# Patient Record
Sex: Female | Born: 1945 | Race: White | Hispanic: No | Marital: Married | Smoking: Never smoker
Health system: Southern US, Community
[De-identification: ages and names within clinical notes are randomized; demographics above are authoritative.]

## PROBLEM LIST (undated history)

## (undated) DIAGNOSIS — G839 Paralytic syndrome, unspecified: Secondary | ICD-10-CM

## (undated) DIAGNOSIS — I10 Essential (primary) hypertension: Secondary | ICD-10-CM

## (undated) DIAGNOSIS — K219 Gastro-esophageal reflux disease without esophagitis: Secondary | ICD-10-CM

## (undated) DIAGNOSIS — G35 Multiple sclerosis: Secondary | ICD-10-CM

## (undated) DIAGNOSIS — K5909 Other constipation: Secondary | ICD-10-CM

## (undated) DIAGNOSIS — G500 Trigeminal neuralgia: Secondary | ICD-10-CM

## (undated) HISTORY — DX: Other constipation: K59.09

## (undated) HISTORY — DX: Essential (primary) hypertension: I10

## (undated) HISTORY — DX: Gastro-esophageal reflux disease without esophagitis: K21.9

## (undated) HISTORY — PX: WISDOM TOOTH EXTRACTION: SHX21

## (undated) HISTORY — PX: TONSILLECTOMY: SUR1361

## (undated) HISTORY — DX: Multiple sclerosis: G35

## (undated) HISTORY — PX: OTHER SURGICAL HISTORY: SHX169

---

## 1976-10-13 DIAGNOSIS — G35 Multiple sclerosis: Secondary | ICD-10-CM

## 1976-10-13 HISTORY — DX: Multiple sclerosis: G35

## 2000-11-06 ENCOUNTER — Encounter: Admission: RE | Admit: 2000-11-06 | Discharge: 2000-11-06 | Payer: Self-pay | Admitting: Family Medicine

## 2000-11-06 ENCOUNTER — Encounter: Payer: Self-pay | Admitting: Family Medicine

## 2001-11-25 ENCOUNTER — Encounter: Admission: RE | Admit: 2001-11-25 | Discharge: 2001-11-25 | Payer: Self-pay | Admitting: Family Medicine

## 2001-11-25 ENCOUNTER — Encounter: Payer: Self-pay | Admitting: Family Medicine

## 2002-08-30 ENCOUNTER — Encounter: Payer: Self-pay | Admitting: Gastroenterology

## 2002-08-30 ENCOUNTER — Encounter: Admission: RE | Admit: 2002-08-30 | Discharge: 2002-08-30 | Payer: Self-pay | Admitting: Gastroenterology

## 2002-09-07 ENCOUNTER — Encounter: Admission: RE | Admit: 2002-09-07 | Discharge: 2002-09-07 | Payer: Self-pay | Admitting: Gastroenterology

## 2002-09-07 ENCOUNTER — Encounter: Payer: Self-pay | Admitting: Gastroenterology

## 2002-09-12 ENCOUNTER — Encounter: Payer: Self-pay | Admitting: Family Medicine

## 2002-09-12 ENCOUNTER — Encounter: Admission: RE | Admit: 2002-09-12 | Discharge: 2002-09-12 | Payer: Self-pay | Admitting: Family Medicine

## 2002-09-19 ENCOUNTER — Observation Stay (HOSPITAL_COMMUNITY): Admission: RE | Admit: 2002-09-19 | Discharge: 2002-09-20 | Payer: Self-pay | Admitting: Gastroenterology

## 2003-10-19 ENCOUNTER — Encounter: Admission: RE | Admit: 2003-10-19 | Discharge: 2003-10-19 | Payer: Self-pay | Admitting: Family Medicine

## 2010-03-26 ENCOUNTER — Encounter: Payer: Self-pay | Admitting: Cardiology

## 2010-07-31 ENCOUNTER — Encounter: Payer: Self-pay | Admitting: Cardiology

## 2010-08-27 ENCOUNTER — Ambulatory Visit (HOSPITAL_COMMUNITY): Admission: RE | Admit: 2010-08-27 | Discharge: 2010-08-27 | Payer: Self-pay | Admitting: Cardiology

## 2010-08-27 ENCOUNTER — Encounter: Payer: Self-pay | Admitting: Cardiology

## 2010-08-27 ENCOUNTER — Ambulatory Visit: Payer: Self-pay

## 2010-08-27 ENCOUNTER — Ambulatory Visit: Payer: Self-pay | Admitting: Cardiology

## 2010-08-27 ENCOUNTER — Ambulatory Visit: Payer: Self-pay | Admitting: Cardiovascular Disease

## 2010-08-27 DIAGNOSIS — R9431 Abnormal electrocardiogram [ECG] [EKG]: Secondary | ICD-10-CM

## 2010-08-27 DIAGNOSIS — R079 Chest pain, unspecified: Secondary | ICD-10-CM | POA: Insufficient documentation

## 2010-08-27 DIAGNOSIS — G35 Multiple sclerosis: Secondary | ICD-10-CM

## 2010-11-03 ENCOUNTER — Encounter: Payer: Self-pay | Admitting: Family Medicine

## 2010-11-06 ENCOUNTER — Ambulatory Visit
Admission: RE | Admit: 2010-11-06 | Discharge: 2010-11-06 | Payer: Self-pay | Source: Home / Self Care | Attending: Cardiology | Admitting: Cardiology

## 2010-11-12 NOTE — Consult Note (Signed)
Summary: [facility] Referral Request   [facility] Referral Request   Imported By: Roderic Ovens 09/20/2010 11:05:23  _____________________________________________________________________  External Attachment:    Type:   Image     Comment:   External Document

## 2010-11-12 NOTE — Assessment & Plan Note (Signed)
Summary: NP6/CHEST PAIN/CARDIAC EVAL   Visit Type:  Initial Consult Primary Provider:  Belva Agee, NP  CC:  chest pain.  History of Present Illness: The patient presents for evaluation of chest discomfort. She had 2 episodes of this in August. It was a sternal discomfort it didn't radiate to her left arm. It felt like a muscle cramp or a she had not had this before.  She took some aspirin. It lasted about 40 minutes. About 2 weeks later she had a similar episode but not as severe. There was no associated nausea or vomiting. She has had some progressive shortness of breath but this has been slowly happening over time.  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 She is completely dependent on her wheelchair and her husband insistence for daily activities. She is not describing PND or orthopnea. She does sleep with her head inclined in her bed. She is not describing palpitations, presyncope or syncope.   Current Medications (verified): 1)  Zoloft 100 Mg Tabs (Sertraline Hcl) .Marland Kitchen.. 1 By Mouth Daily 2)  Baclofen 20 Mg Tabs (Baclofen) .... 2 By Mouth Two Times A Day 3)  Vesicare 10 Mg Tabs (Solifenacin Succinate) .Marland Kitchen.. 1 Podaily 4)  Carbatrol 300 Mg Xr12h-Cap (Carbamazepine) .Marland Kitchen.. 1 By Mouth Daily 5)  Carbatrol 200 Mg Xr12h-Cap (Carbamazepine) .Marland Kitchen.. 1 By Mouth Daily 6)  Neurontin 300 Mg Caps (Gabapentin) .Marland Kitchen.. 1 By Mouth Dialy 7)  Ferro-Bob 325 (65 Fe) Mg Tabs (Ferrous Sulfate) .Marland Kitchen.. 1 By Mouth Daily 8)  Vitamin E 600 Unit Caps (Vitamin E) .Marland Kitchen.. 1 By Mouth Daily 9)  Calcium 1500 Mg Tabs (Calcium Carbonate) .Marland Kitchen.. 1 By Mouth Daily 10)  Abonax Inj .Marland Kitchen.. 1 Inj Weekly  Allergies (verified): 1)  ! * Microdantin 2)  ! Sulfa  Past History:  Past Medical History: Multiple Sclerosis 1978  Past Surgical History: Wisdom teath Tonsillectomy Suprapubic catheter Cholescystectomy  Family History: Father died of MI 38s Mother DM, died MI age 29  Social  History: Patient is married. She has no children. She's never smoked cigarettes. She doesn't drink alcohol.  Review of Systems       Positive for constipation and edema. Otherwise negative for all other systems.  Vital Signs:  Patient profile:   65 year old female Height:      62 inches Weight:      120 pounds BMI:     22.03 Pulse rate:   85 / minute Resp:     16 per minute BP sitting:   121 / 63  (right arm)  Vitals Entered By: Marrion Coy, CNA (August 27, 2010 1:50 PM)  Physical Exam  General:  Chronically ill-appearing but in no distress Head:  normocephalic and atraumatic Eyes:  PERRLA/EOM intact; conjunctiva and lids normal. Mouth:  Teeth, gums and palate normal. Oral mucosa normal. Neck:  Neck supple, no JVD. No masses, thyromegaly or abnormal cervical nodes. Chest Wall:  no deformities or breast masses noted Lungs:  Clear bilaterally to auscultation and percussion. Abdomen:  Bowel sounds positive; abdomen soft and non-tender without masses, organomegaly, or hernias noted. No hepatosplenomegaly. Msk:  Diffuse muscle wasting with contractions Extremities:  mild diffuse lower extremity edema Neurologic:  Alert and oriented x 3. Cervical Nodes:  no significant adenopathy Psych:  Normal affect.   Detailed Cardiovascular Exam  Neck    Carotids: Carotids full and equal bilaterally without bruits.      Neck Veins: Normal, no JVD.  Heart    Inspection: no deformities or lifts noted.      Palpation: normal PMI with no thrills palpable.      Auscultation: regular rate and rhythm, S1, S2 without murmurs, rubs, gallops, or clicks.    Vascular    Abdominal Aorta: no palpable masses, pulsations, or audible bruits.      Femoral Pulses: normal femoral pulses bilaterally.      Pedal Pulses: normal pedal pulses bilaterally.      Radial Pulses: normal radial pulses bilaterally.      Peripheral Circulation: no clubbing, cyanosis, or edema noted with normal capillary refill.      EKG  Procedure date:  07/30/2010  Findings:      Sinus rhythm, rate 73, left axis deviation, poor anterior R-wave progression  Impression & Recommendations:  Problem # 1:  ELECTROCARDIOGRAM, ABNORMAL (ICD-794.31) Patient did have new poor anterior R-wave progression and chest discomfort. She certainly has risk factors with her family history. She husband would like conservative management. I will order an echocardiogram to see if there has been demonstratable wall motion abnormality or a reduced ejection fraction. This will guide therapy. Otherwise in the absence of ongoing symptoms I would suggest nothing but to continue the aspirin which she started. Orders: Echocardiogram (Echo)  Problem # 2:  MULTIPLE SCLEROSIS, PROGRESSIVE/RELAPSING (ICD-340) The patient has severe multiple sclerosis and again wants conservative management.  Patient Instructions: 1)  Your physician recommends that you schedule a follow-up appointment in: 6 weeks in [place] office 2)  Your physician recommends that you continue on your current medications as directed. Please refer to the Current Medication list given to you today. 3)  Your physician has requested that you have an echocardiogram.  Echocardiography is a painless test that uses sound waves to create images of your heart. It provides your doctor with information about the size and shape of your heart and how well your heart's chambers and valves are working.  This procedure takes approximately one hour. There are no restrictions for this procedure.

## 2010-11-14 NOTE — Progress Notes (Signed)
Summary: [facility]   [facility] Office Note   Imported By: Roderic Ovens 09/24/2010 11:20:29  _____________________________________________________________________  External Attachment:    Type:   Image     Comment:   External Document

## 2010-11-14 NOTE — Assessment & Plan Note (Signed)
Summary: [facility]   Visit Type:  Follow-up Primary Provider:  Belva Agee, NP  CC:  chest pain.  History of Present Illness: The patient presents for follow up of chest pain.  Since I last saw her she has had no further chest pain.  At that time an echo demonstrated wall motion abnormalities and she was having no further symptoms no did not pursue a more aggressive evaluation. Since August of last year she has had no recurrent episodes. She has had no new shortness of breath, PND or orthopnea. She is paralyzed with multiple sclerosis and does have dyspnea secondary to muscular weakness.  Current Medications (verified): 1)  Zoloft 100 Mg Tabs (Sertraline Hcl) .Marland Kitchen.. 1 By Mouth Daily 2)  Baclofen 20 Mg Tabs (Baclofen) .... 2 By Mouth Two Times A Day 3)  Vesicare 10 Mg Tabs (Solifenacin Succinate) .Marland Kitchen.. 1 Podaily 4)  Carbatrol 300 Mg Xr12h-Cap (Carbamazepine) .Marland Kitchen.. 1 By Mouth Daily 5)  Carbatrol 200 Mg Xr12h-Cap (Carbamazepine) .Marland Kitchen.. 1 By Mouth Daily 6)  Neurontin 300 Mg Caps (Gabapentin) .Marland Kitchen.. 1 By Mouth Dialy 7)  Vitamin E 600 Unit Caps (Vitamin E) .Marland Kitchen.. 1 By Mouth Daily 8)  Abonax Inj .Marland Kitchen.. 1 Inj Weekly 9)  Bactrim .... 2 By Mouth Daily  Allergies (verified): 1)  ! * Microdantin 2)  ! Sulfa  Past History:  Past Medical History: Last updated: 08/27/2010 Multiple Sclerosis 1978  Past Surgical History: Reviewed history from 08/27/2010 and no changes required. Wisdom teath Tonsillectomy Suprapubic catheter Cholescystectomy  Review of Systems       As stated in the HPI and negative for all other systems.   Vital Signs:  Patient profile:   65 year old female Height:      62 inches Pulse rate:   80 / minute Resp:     16 per minute BP sitting:   108 / 66  (right arm)  Vitals Entered By: Marrion Coy, CNA (November 06, 2010 12:04 PM)  Physical Exam  General:  Well developed, well nourished, in no acute distress. Head:  normocephalic and atraumatic Neck:  Neck supple,  no JVD. No masses, thyromegaly or abnormal cervical nodes. Chest Wall:  no deformities or breast masses noted Lungs:  Clear bilaterally to auscultation and percussion. Heart:  Non-displaced PMI, chest non-tender; regular rate and rhythm, S1, S2 without murmurs, rubs or gallops. Carotid upstroke normal, no bruit. Normal abdominal aortic size, no bruits. Femorals normal pulses, no bruits. Pedals normal pulses. No edema, no varicosities. Abdomen:  Bowel sounds positive; abdomen soft and non-tender without masses, organomegaly, or hernias noted. No hepatosplenomegaly. Msk:  Diffuse muscle wasting with contractions Extremities:  mild diffuse lower extremity edema Neurologic:  Alert and oriented x 3.   Impression & Recommendations:  Problem # 1:  CHEST PAIN (ICD-786.50) Patient has had symptoms. No change in therapy is indicated. No further evaluation is indicated. She can come back to see [place] as needed.  Patient Instructions: 1)  Your physician recommends that you schedule a follow-up appointment as needed 2)  Your physician recommends that you continue on your current medications as directed. Please refer to the Current Medication list given to you today.

## 2011-01-29 ENCOUNTER — Encounter: Payer: Self-pay | Admitting: Cardiology

## 2011-02-04 ENCOUNTER — Telehealth: Payer: Self-pay | Admitting: Cardiology

## 2011-02-04 NOTE — Telephone Encounter (Signed)
Pt's husband calling due to pt's bp high at night then levels off in the am, pt on metoprolol given to her in the hospital-pls advise

## 2011-02-04 NOTE — Telephone Encounter (Signed)
Per call from husband - states pt went into [facility] 01/12/2011 for a kidney stone removal and while there had multiple problems including respiratory failure.  While there she was being treated for HTN and was placed on Metoprolol 50 mg daily.  This was increased 1 week ago to bid as her BP is remaining elevated.  This am her BP was 183/90 HR 97 and at 12 N 152/83 HR 105.  Husband states that pt is in some pain with mouth ulcers and knee pain.  I suggested the pt be seen back by the MD who started her on and increased her Metoprolol however was told by the husband that those doctors were seeing her for her kidney stone and do not wish to treat her hypertension.  Instructed husband to continue current medication and continue to check pt's BP and HR.  Suggested he call the MD how treats her pain for better pain control.  She will see Dr Antoine Poche in the [place] office 02/12/2011 at 3:45pm.  Husband is agreeable and will call back if BP doesn't continue to improve.

## 2011-02-05 ENCOUNTER — Telehealth: Payer: Self-pay | Admitting: Cardiology

## 2011-02-05 NOTE — Telephone Encounter (Signed)
Faxed to Release forms to  1. [facility] @ [phone] 2.[facility] @ [phone] 02/04/11/KM

## 2011-02-05 NOTE — Telephone Encounter (Signed)
Records received from [facility]...gave to Lela 02/05/11/Km

## 2011-02-11 ENCOUNTER — Encounter: Payer: Self-pay | Admitting: Cardiology

## 2011-02-12 ENCOUNTER — Ambulatory Visit (INDEPENDENT_AMBULATORY_CARE_PROVIDER_SITE_OTHER): Payer: Medicare Other | Admitting: Cardiology

## 2011-02-12 ENCOUNTER — Encounter: Payer: Self-pay | Admitting: Cardiology

## 2011-02-12 DIAGNOSIS — R9431 Abnormal electrocardiogram [ECG] [EKG]: Secondary | ICD-10-CM

## 2011-02-12 DIAGNOSIS — I10 Essential (primary) hypertension: Secondary | ICD-10-CM | POA: Insufficient documentation

## 2011-02-12 DIAGNOSIS — R079 Chest pain, unspecified: Secondary | ICD-10-CM

## 2011-02-12 MED ORDER — METOPROLOL TARTRATE 50 MG PO TABS: 50.0000 mg | ORAL_TABLET | Freq: Two times a day (BID) | ORAL | Status: DC

## 2011-02-12 NOTE — Assessment & Plan Note (Signed)
At this point I see no contraindication to beta blockers. She tolerates. Her husband blood pressure diary. If her blood pressure creeps up we will adjust meds.

## 2011-02-12 NOTE — Patient Instructions (Signed)
Continue current medications Follow up with Dr Antoine Poche in [place] in 3 months

## 2011-02-12 NOTE — Progress Notes (Signed)
HPI The patient presents for hypertension. She has had significant problems since I last saw her. She had kidney stone extraction and ended up with obstructive bowel, respiratory failure on BiPAP, pneumonia, renal insufficiency and hypertensive urgency at [facility]. She was readmitted later to another hospital with GI bleeding. She was told after finally being discharged from these hospitalizations to follow with a cardiologist for blood pressure. She was discharged on beta blockers. Her husband has been watching her blood pressures have been in the 140s over 80s. Her blood pressure is well controlled today. She has not had any chest pressure, neck or arm discomfort. There is no apparent palpitation or syncope. She is of course severely limited with her multiple sclerosis.  Allergies  Allergen Reactions  . Sulfonamide Derivatives     Current Outpatient Prescriptions  Medication Sig Dispense Refill  . acetaminophen (TYLENOL) 500 MG tablet Take 500 mg by mouth. 6 TIMES A DAY       . Alum & Mag Hydroxide-Simeth (MAGIC MOUTHWASH) SOLN Take by mouth. AS DIRECTED       . carbamazepine (CARBATROL) 300 MG 12 hr capsule Take 300 mg by mouth 2 (two) times daily.        . ferrous sulfate 325 (65 FE) MG tablet Take 325 mg by mouth 3 (three) times daily with meals.        . fluconazole (DIFLUCAN) 100 MG tablet Take 100 mg by mouth daily.        Marland Kitchen gabapentin (NEURONTIN) 300 MG capsule Take 300 mg by mouth. 1 TAB DAILY      . L-Lysine 500 MG TABS Take by mouth.        . metoprolol (LOPRESSOR) 50 MG tablet Take 50 mg by mouth 2 (two) times daily.        . pantoprazole (PROTONIX) 40 MG tablet Take 40 mg by mouth. TWICE A DAY       . sertraline (ZOLOFT) 100 MG tablet Take 100 mg by mouth daily.        . solifenacin (VESICARE) 10 MG tablet Take 5 mg by mouth daily.        . vitamin E 600 UNIT capsule Take 600 Units by mouth daily.        . baclofen (LIORESAL) 20 MG tablet Take 20 mg by mouth 3 (three)  times daily.        Marland Kitchen DISCONTD: carbamazepine (CARBATROL) 200 MG 12 hr capsule Take 200 mg by mouth 2 (two) times daily.        Marland Kitchen DISCONTD: carbamazepine (CARBATROL) 300 MG 12 hr capsule Take 300 mg by mouth. DAILY         Past Medical History  Diagnosis Date  . Multiple sclerosis   . Hypertension   . GERD (gastroesophageal reflux disease)   . Chronic constipation     Past Surgical History  Procedure Date  . Wisdom tooth extraction   . Tonsillectomy   . Suprapubic catheter   . Cholescystectomy     ROS:  Positive for fatigue, dyspnea, trigeminal neuralgia. Otherwise as stated in the history of present illness negative for all other systems.  PHYSICAL EXAM BP 124/67  Pulse 73  Ht 5\' 2"  (1.575 m)  Wt 115 lb (52.164 kg)  BMI 21.03 kg/m2General:  Chronically ill-appearing but in no distress Head:  normocephalic and atraumatic Eyes:  PERRLA/EOM intact; conjunctiva and lids normal. Mouth:  Teeth, gums and palate normal. Oral mucosa normal. Neck:  Neck supple, no JVD. No  masses, thyromegaly or abnormal cervical nodes. Chest Wall:  no deformities or breast masses noted Lungs:  Clear bilaterally to auscultation and percussion. Abdomen:  Bowel sounds positive; abdomen soft and non-tender without masses, organomegaly, or hernias noted. No hepatosplenomegaly. Msk:  Diffuse muscle wasting with contractions Extremities:  mild diffuse lower extremity edema Neurologic:  Alert and oriented x 3. Cervical Nodes:  no significant adenopathy Psych:  Normal affect.:  ASSESSMENT AND PLAN

## 2011-02-12 NOTE — Assessment & Plan Note (Signed)
She denies any chest pain.  No change in therapy is indicated.

## 2011-02-25 NOTE — Telephone Encounter (Signed)
Records received from [facility] to [place] 02/25/11/km

## 2011-02-28 NOTE — Op Note (Signed)
NAME:  Katie Woods, Katie Woods                        ACCOUNT NO.:  [id number]   MEDICAL RECORD NO.:  [id number]                   PATIENT TYPE:  OBV   LOCATION:  0455                                 FACILITY:  [facility]   PHYSICIAN:  Petra Kuba, M.D.                 DATE OF BIRTH:  1946-06-19   DATE OF PROCEDURE:  09/20/2002  DATE OF DISCHARGE:                                 OPERATIVE REPORT   PROCEDURE:  Colonoscopy.   INDICATIONS:  Patient with pain, weight loss, constipation, due for colonic  screening.  Consent was signed after risks, benefits, methods, and options  thoroughly discussed with both the patient and her husband.   MEDICATIONS:  Demerol 40 mg, Versed 4 mg.   DESCRIPTION OF PROCEDURE:  Rectal inspection was pertinent for external  hemorrhoids, small.  Digital exam was negative.  The pediatric video  adjustable colonoscope was inserted and despite a long, looping, and  tortuous colon was able to be advanced to the cecum.  This did require  rolling her on her back and some abdominal pressure.  No obvious abnormality  was seen on insertion.  The cecum was identified by the appendiceal orifice  and the ileocecal valve; in fact, the scope was inserted a short way into  the terminal ileum, which was normal.  Photo documentation was obtained.  The scope was slowly withdrawn.  The prep was fair at best.  There was  liquid stool throughout the colon, not all of which could be suctioned due  to clogging the scope on multiple occasions; however, on slow withdrawal  through the colon, no mass lesions, polyps, diverticula, signs of bleeding,  or any other abnormality was seen.  Once back in the rectum the scope was  retroflexed, revealing some internal hemorrhoids.  The scope was  straightened and readvanced a short way up the left side of the colon, air  was suctioned, the scope removed.  The patient tolerated the procedure well.  There was no obvious immediate complication.   ENDOSCOPIC DIAGNOSES:  1. Internal-external hemorrhoids.  2. Tortuous, long, looping colon.  3. Fair prep at best.  4. Otherwise within normal limits to the terminal ileum.    PLAN:  1. Continue Miralax and Bentyl since that seems to be helping.  2. Be happy to see back p.r.n., although difficult to get back in the office     per Dr. Collins Scotland.  Might want to consider upgrading the Bentyl to Librax or     even a trial of Zelnorm to help.                                               Petra Kuba, M.D.    MEM/MEDQ  D:  09/20/2002  T:  09/20/2002  Job:  [id number]   cc:   Tammy R. Collins [place], M.D.  [address]  [place]  [place] [address]  Fax: [phone]

## 2011-02-28 NOTE — H&P (Signed)
   NAME:  Katie Woods, Katie Woods NO.:  [id number]   MEDICAL RECORD NO.:  [id number]                   PATIENT TYPE:   LOCATION:                                       FACILITY:   PHYSICIAN:  Petra Kuba, M.D.                 DATE OF BIRTH:   DATE OF ADMISSION:  DATE OF DISCHARGE:                                HISTORY & PHYSICAL   HISTORY OF PRESENT ILLNESS:  The patient was admitted for elective GI prep  due to chronic abdominal pain and weight loss. Non-diagnostic workup to  date. Needing a colonoscopy. Possibly moving her bowels better has helped.  She has had a non-diagnostic CT ultrasound and upper GI small bowel follow  through.  The __ did seem to make her worse.   PAST MEDICAL HISTORY:  Pertinent for multiple sclerosis.   PAST SURGICAL HISTORY:  Cholecystectomy.   MEDICATIONS:  Include Baclofen, Nexium, Zoloft, Carbatrol, Zanaflex, Avonex,  Neurontin, Claritin, and Hiprex.   ALLERGIES:  MACRODANTIN.   FAMILY HISTORY:  Negative for any GI problems.   REVIEW OF SYSTEMS:  Fairly positive on multiple organ systems.   PHYSICAL EXAMINATION:  GENERAL: No acute distress.  HEENT: Sclera nonicteric.  VITAL SIGNS: See chart.  LUNGS: Clear.  HEART: Regular rate and rhythm.  ABDOMEN: Soft and nontender.   ASSESSMENT:  Abdominal pain and weight loss with non-diagnostic workup to  date. Overdue to colonic screening in a patient with multiple sclerosis.    PLAN:  Will go ahead with colonoscopy. Her and her husband and I have  discussed this in the office with further workup and plans. Please see that  dictation.                                               Petra Kuba, M.D.    MEM/MEDQ  D:  09/20/2002  T:  09/20/2002  Job:  [id number]   cc:   Tammy R. Collins [place], M.D.  [address]  [place]  [place] [address]  Fax: [phone]

## 2011-05-15 ENCOUNTER — Encounter: Payer: Self-pay | Admitting: Cardiology

## 2011-05-28 ENCOUNTER — Encounter: Payer: Self-pay | Admitting: Cardiology

## 2011-05-28 ENCOUNTER — Ambulatory Visit (INDEPENDENT_AMBULATORY_CARE_PROVIDER_SITE_OTHER): Payer: Medicare Other | Admitting: Cardiology

## 2011-05-28 DIAGNOSIS — R079 Chest pain, unspecified: Secondary | ICD-10-CM

## 2011-05-28 DIAGNOSIS — I10 Essential (primary) hypertension: Secondary | ICD-10-CM

## 2011-05-28 NOTE — Assessment & Plan Note (Signed)
Her blood pressure fluctuates but for the most part is controlled.  I did discuss prn dosing of the beta blocker if her pressure is up and sustained.  No further work up is indicated.

## 2011-05-28 NOTE — Patient Instructions (Signed)
Follow up as needed  The current medical regimen is effective;  continue present plan and medications.  

## 2011-05-28 NOTE — Progress Notes (Signed)
HPI The patient presents for hypertension. Since I last saw her she has had trouble with pain secondary to trigeminal neuralgia.  With this pain she has had some problems with her blood.  Her husband is not give her extra medication as he is afraid of "bottoming her out".  Her blood pressure goes down after her pain goes away.  She has had no chest pressure and now new SOB.  Allergies  Allergen Reactions  . Sulfonamide Derivatives     Current Outpatient Prescriptions  Medication Sig Dispense Refill  . acetaminophen (TYLENOL) 500 MG tablet Take 500 mg by mouth. 6 TIMES A DAY       . Alum & Mag Hydroxide-Simeth (MAGIC MOUTHWASH) SOLN Take by mouth. AS DIRECTED       . baclofen (LIORESAL) 20 MG tablet Take 20 mg by mouth 3 (three) times daily.        . carbamazepine (CARBATROL) 300 MG 12 hr capsule Take 300 mg by mouth 2 (two) times daily.        . ferrous sulfate 325 (65 FE) MG tablet Take 325 mg by mouth 3 (three) times daily with meals.        . fluconazole (DIFLUCAN) 100 MG tablet Take 100 mg by mouth daily.        Marland Kitchen gabapentin (NEURONTIN) 300 MG capsule Take 300 mg by mouth. 1 TAB DAILY      . L-Lysine 500 MG TABS Take by mouth.        . metoprolol (LOPRESSOR) 50 MG tablet Take 1 tablet (50 mg total) by mouth 2 (two) times daily.  180 tablet  3  . NON FORMULARY Inject as directed once a week. Abonax Inj       . pantoprazole (PROTONIX) 40 MG tablet Take 40 mg by mouth. TWICE A DAY       . sertraline (ZOLOFT) 100 MG tablet Take 100 mg by mouth daily.        . solifenacin (VESICARE) 10 MG tablet Take 5 mg by mouth daily.        . Sulfamethoxazole-Trimethoprim (BACTRIM PO) Take 2 tablets by mouth daily.        . vitamin E 600 UNIT capsule Take 600 Units by mouth daily.          Past Medical History  Diagnosis Date  . Multiple sclerosis 1978  . Hypertension   . GERD (gastroesophageal reflux disease)   . Chronic constipation     Past Surgical History  Procedure Date  . Wisdom tooth  extraction   . Tonsillectomy   . Suprapubic catheter   . Cholescystectomy     ROS:  Positive for fatigue, dyspnea, trigeminal neuralgia. Otherwise as stated in the history of present illness negative for all other systems.  PHYSICAL EXAM BP 122/68  Pulse 81  Resp 16 General:  Chronically ill-appearing but in no distress Head:  normocephalic and atraumatic Eyes:  PERRLA/EOM intact; conjunctiva and lids normal. Mouth:  Teeth, gums and palate normal. Oral mucosa normal. Neck:  Neck supple, no JVD. No masses, thyromegaly or abnormal cervical nodes. Chest Wall:  no deformities or breast masses noted Lungs:  Clear bilaterally to auscultation and percussion. Abdomen:  Bowel sounds positive; abdomen soft and non-tender without masses, organomegaly, or hernias noted. No hepatosplenomegaly. Msk:  Diffuse muscle wasting with contractions Extremities:  mild diffuse lower extremity edema Neurologic:  Alert and oriented x 3. Cervical Nodes:  no significant adenopathy Psych:  Normal affect.:  EKG:  NSR, rate 80 LAD, no acute ST T wave changes.  ASSESSMENT AND PLAN

## 2011-06-12 ENCOUNTER — Telehealth: Payer: Self-pay | Admitting: Cardiology

## 2011-06-12 DIAGNOSIS — I10 Essential (primary) hypertension: Secondary | ICD-10-CM

## 2011-06-12 NOTE — Telephone Encounter (Signed)
[employer] [facility]. 30 days supply  90 days supply [facility].

## 2011-06-13 MED ORDER — METOPROLOL TARTRATE 50 MG PO TABS: 50.0000 mg | ORAL_TABLET | Freq: Two times a day (BID) | ORAL | Status: DC

## 2011-06-13 NOTE — Telephone Encounter (Signed)
Pt will be out of med by Tuesday and needs refill called in today to [facility] pharmacy for 30 days, then 90 days to [facility]

## 2012-04-26 ENCOUNTER — Other Ambulatory Visit: Payer: Self-pay | Admitting: Family Medicine

## 2012-04-26 DIAGNOSIS — G8250 Quadriplegia, unspecified: Secondary | ICD-10-CM

## 2012-04-26 DIAGNOSIS — Z1231 Encounter for screening mammogram for malignant neoplasm of breast: Secondary | ICD-10-CM

## 2012-05-11 ENCOUNTER — Ambulatory Visit
Admission: RE | Admit: 2012-05-11 | Discharge: 2012-05-11 | Disposition: A | Discharge status: Home / Self Care | Payer: Medicare Other | Source: Ambulatory Visit | Attending: Family Medicine | Admitting: Family Medicine

## 2012-05-11 DIAGNOSIS — G8250 Quadriplegia, unspecified: Secondary | ICD-10-CM

## 2012-05-11 DIAGNOSIS — Z1231 Encounter for screening mammogram for malignant neoplasm of breast: Secondary | ICD-10-CM

## 2012-05-13 ENCOUNTER — Other Ambulatory Visit: Payer: Self-pay | Admitting: Family Medicine

## 2012-05-13 DIAGNOSIS — R928 Other abnormal and inconclusive findings on diagnostic imaging of breast: Secondary | ICD-10-CM

## 2012-05-20 ENCOUNTER — Ambulatory Visit
Admission: RE | Admit: 2012-05-20 | Discharge: 2012-05-20 | Disposition: A | Discharge status: Home / Self Care | Payer: Medicare Other | Source: Ambulatory Visit | Attending: Family Medicine | Admitting: Family Medicine

## 2012-05-20 DIAGNOSIS — R928 Other abnormal and inconclusive findings on diagnostic imaging of breast: Secondary | ICD-10-CM

## 2012-10-14 ENCOUNTER — Other Ambulatory Visit (HOSPITAL_COMMUNITY): Payer: Self-pay | Admitting: Oral and Maxillofacial Surgery

## 2012-10-14 DIAGNOSIS — R609 Edema, unspecified: Secondary | ICD-10-CM

## 2012-10-19 ENCOUNTER — Ambulatory Visit (HOSPITAL_COMMUNITY)
Admission: RE | Admit: 2012-10-19 | Discharge: 2012-10-19 | Disposition: A | Discharge status: Home / Self Care | Payer: Medicare Other | Source: Ambulatory Visit | Attending: Oral and Maxillofacial Surgery | Admitting: Oral and Maxillofacial Surgery

## 2012-10-19 DIAGNOSIS — R6884 Jaw pain: Secondary | ICD-10-CM | POA: Insufficient documentation

## 2012-10-19 DIAGNOSIS — J320 Chronic maxillary sinusitis: Secondary | ICD-10-CM | POA: Insufficient documentation

## 2012-10-19 DIAGNOSIS — R220 Localized swelling, mass and lump, head: Secondary | ICD-10-CM | POA: Insufficient documentation

## 2012-10-19 DIAGNOSIS — R609 Edema, unspecified: Secondary | ICD-10-CM

## 2012-10-19 DIAGNOSIS — R51 Headache: Secondary | ICD-10-CM | POA: Insufficient documentation

## 2012-10-19 LAB — CREATININE, SERUM: GFR calc Af Amer: >90 mL/min (ref >90)

## 2012-10-19 LAB — BUN: BUN: 37 mg/dL — ABNORMAL HIGH (ref 6–23)

## 2012-10-19 MED ORDER — IOHEXOL 300 MG/ML  SOLN
80.0000 mL | Freq: Once | INTRAMUSCULAR | Status: AC | PRN
  Administered 2012-10-19: 80 mL via INTRAVENOUS

## 2013-11-21 DIAGNOSIS — N319 Neuromuscular dysfunction of bladder, unspecified: Secondary | ICD-10-CM | POA: Diagnosis not present

## 2013-12-12 DIAGNOSIS — R319 Hematuria, unspecified: Secondary | ICD-10-CM | POA: Diagnosis not present

## 2013-12-12 DIAGNOSIS — R509 Fever, unspecified: Secondary | ICD-10-CM | POA: Diagnosis not present

## 2013-12-12 DIAGNOSIS — R82998 Other abnormal findings in urine: Secondary | ICD-10-CM | POA: Diagnosis not present

## 2013-12-12 DIAGNOSIS — J069 Acute upper respiratory infection, unspecified: Secondary | ICD-10-CM | POA: Diagnosis not present

## 2013-12-12 DIAGNOSIS — R110 Nausea: Secondary | ICD-10-CM | POA: Diagnosis not present

## 2013-12-26 DIAGNOSIS — R319 Hematuria, unspecified: Secondary | ICD-10-CM | POA: Diagnosis not present

## 2013-12-26 DIAGNOSIS — G35 Multiple sclerosis: Secondary | ICD-10-CM | POA: Diagnosis not present

## 2013-12-26 DIAGNOSIS — I10 Essential (primary) hypertension: Secondary | ICD-10-CM | POA: Diagnosis not present

## 2013-12-26 DIAGNOSIS — G500 Trigeminal neuralgia: Secondary | ICD-10-CM | POA: Diagnosis not present

## 2013-12-26 DIAGNOSIS — G8250 Quadriplegia, unspecified: Secondary | ICD-10-CM | POA: Diagnosis not present

## 2013-12-26 DIAGNOSIS — N390 Urinary tract infection, site not specified: Secondary | ICD-10-CM | POA: Diagnosis not present

## 2014-03-13 DIAGNOSIS — R319 Hematuria, unspecified: Secondary | ICD-10-CM | POA: Diagnosis not present

## 2014-03-23 IMAGING — CT CT NECK W/ CM
4 of 5 series · 15 of 33 positions shown, 17 images · IV contrast (CONTRAST)
Comparison: None.

CLINICAL DATA: Right jaw and facial pain and swelling.

CT NECK WITH CONTRAST
TECHNIQUE: Multidetector CT imaging of the neck was performed with
intravenous contrast.
Contrast: 80mL OMNIPAQUE IOHEXOL 300 MG/ML  SOLN

[Series 2: soft tissue · axial · 0.52mm/px · z∈[-127,+29]mm · 5 of 118 slices shown]
[im 20/118  soft-tissue]
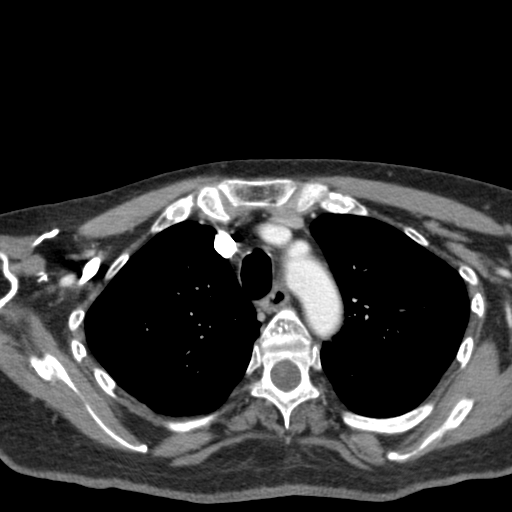
[im 40/118  soft-tissue]
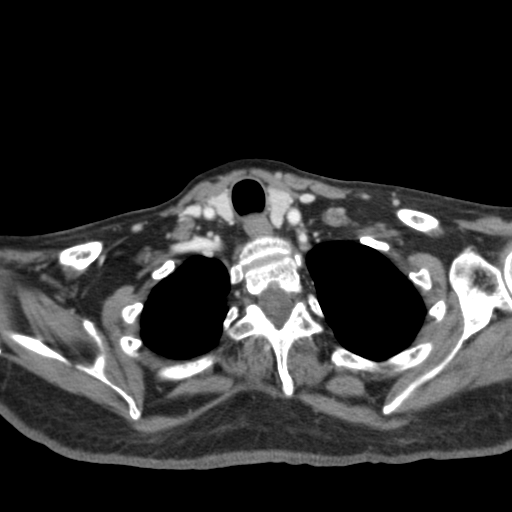
[im 59/118  soft-tissue]
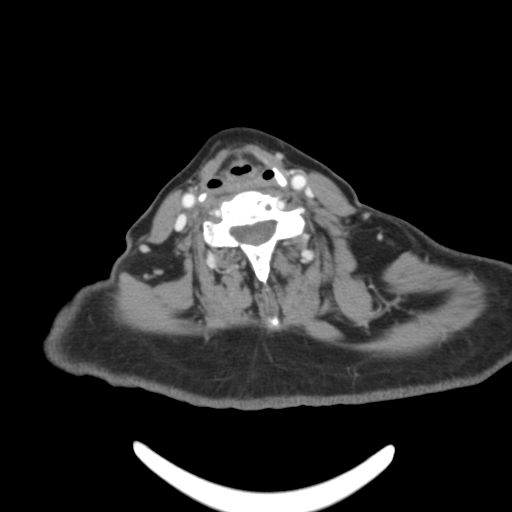
[im 79/118  soft-tissue]
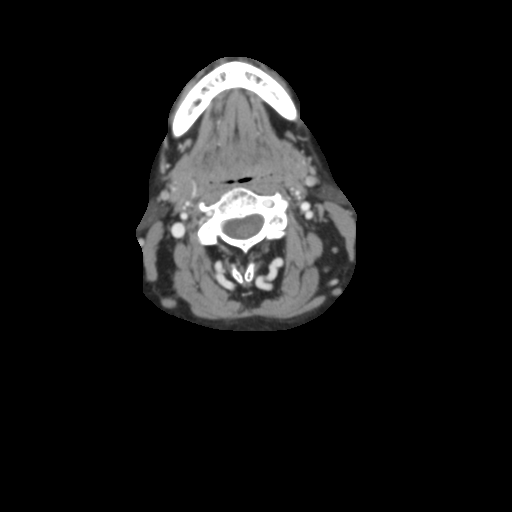
[im 98/118  soft-tissue]
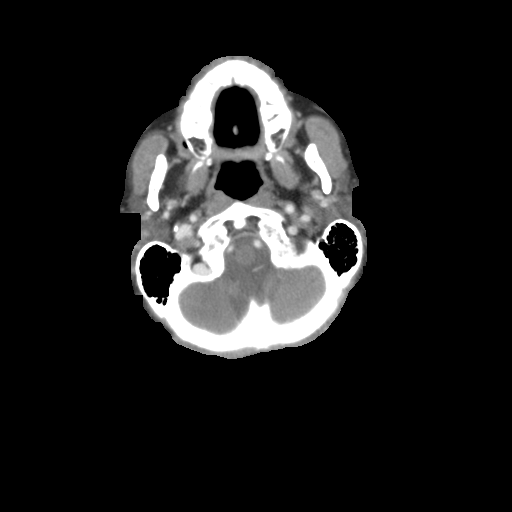

[axial · axial · 0.52mm/px · z∈[-92,-20]mm · 2 of 74 slices shown, 3 images]
[im 25/74  soft-tissue]
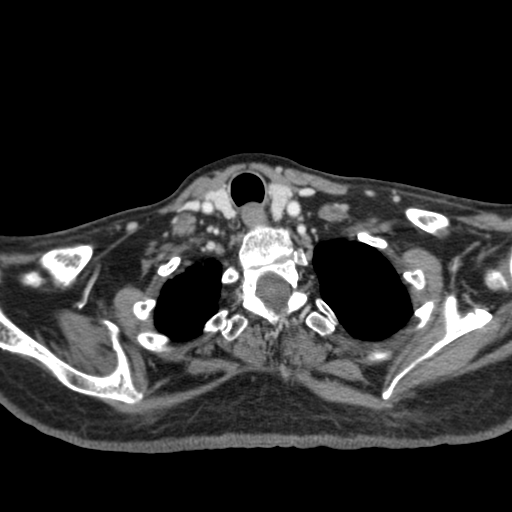
[im 25/74  bone]
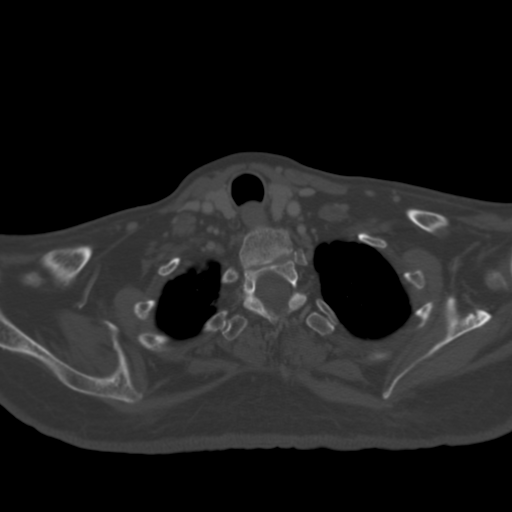
[im 49/74  bone]
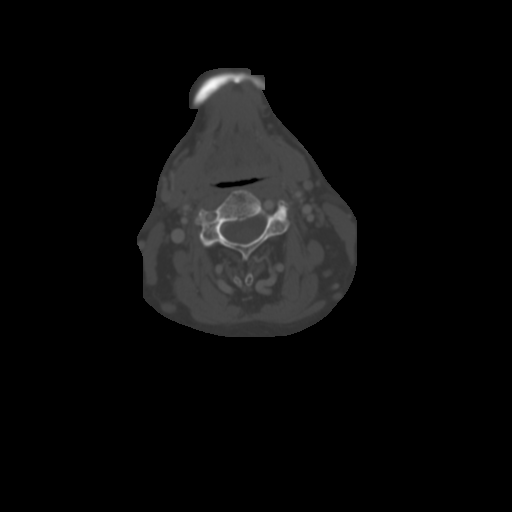

[coronal · coronal · 0.52mm/px · 3 of 67 slices shown]
[im 14/67  bone]
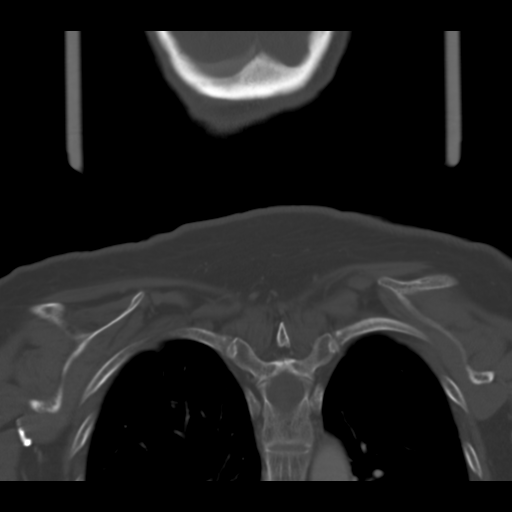
[im 27/67  bone]
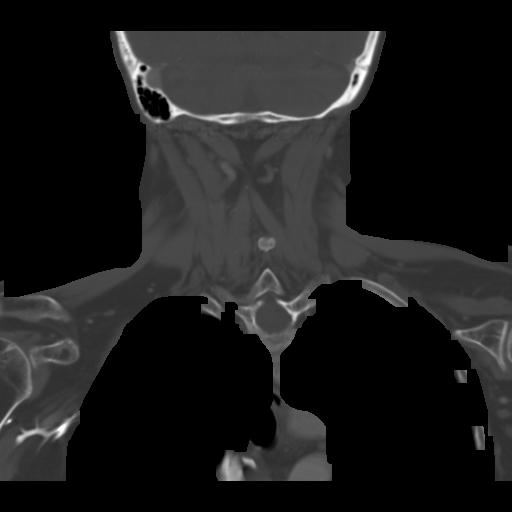
[im 40/67  bone]
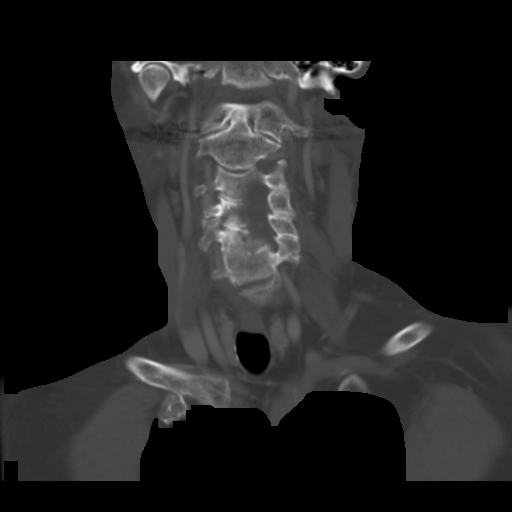

[sagittal · sagittal · 0.52mm/px · 5 of 84 slices shown, 6 images]
[im 28/84  bone]
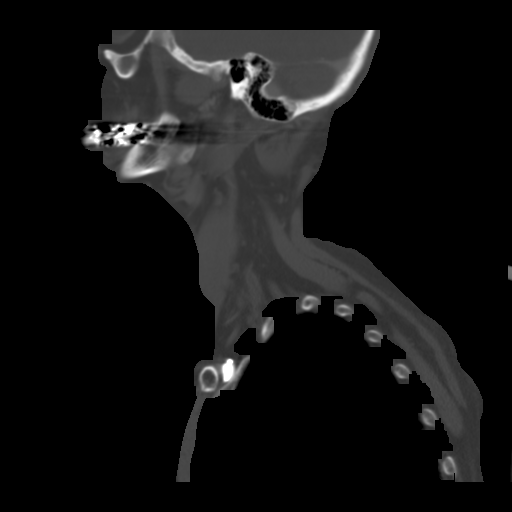
[im 35/84  bone]
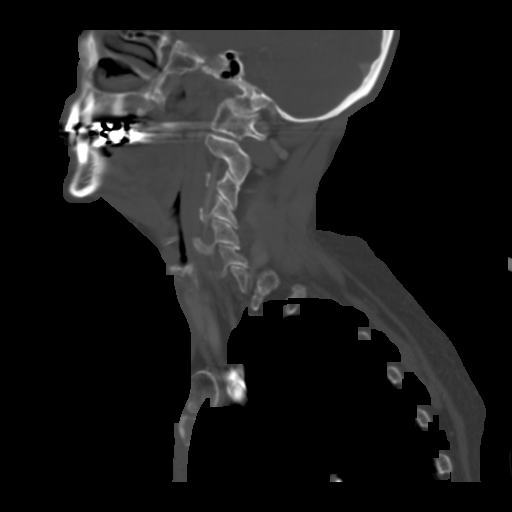
[im 42/84  soft-tissue]
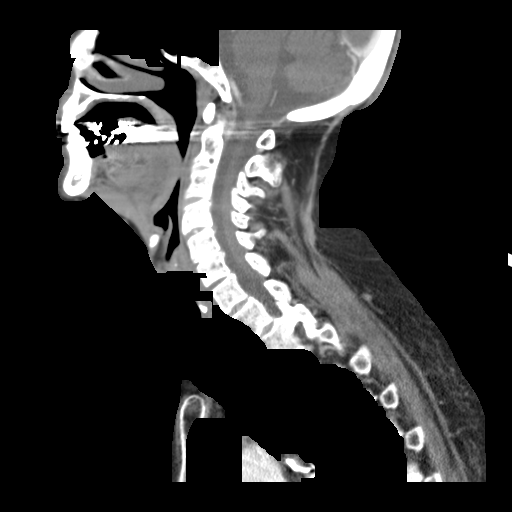
[im 42/84  bone]
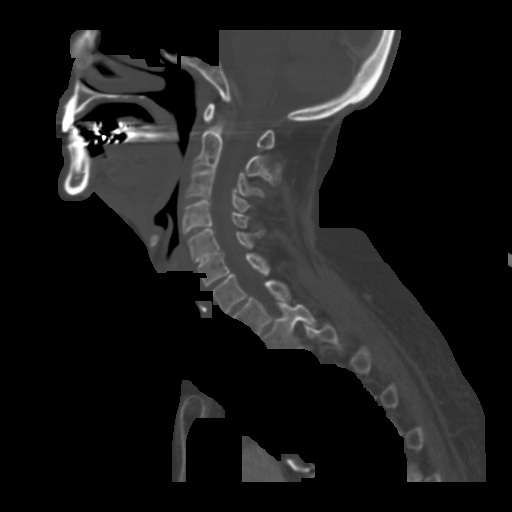
[im 49/84  bone]
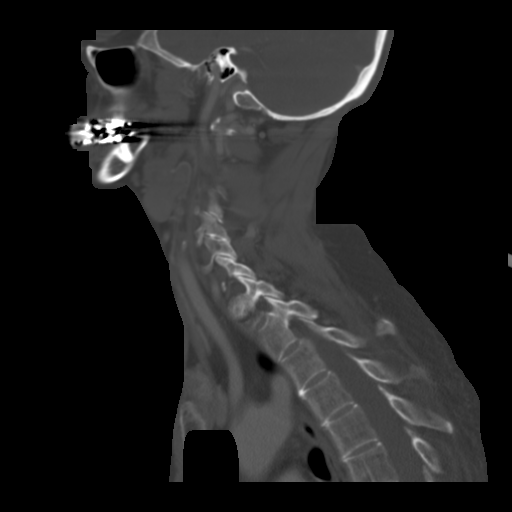
[im 56/84  bone]
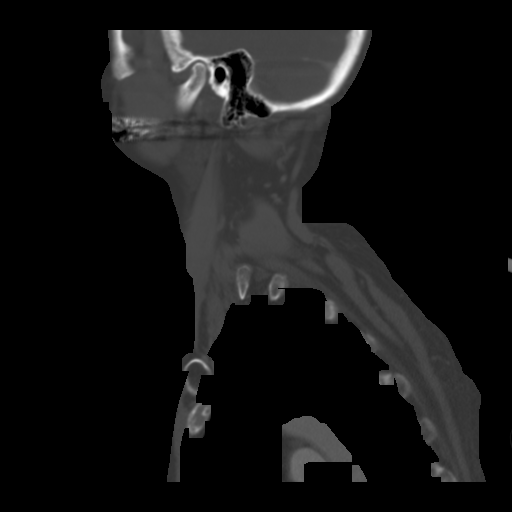

[15 of 33 positions shown; findings below may reference images not displayed]

FINDINGS: BB marks a symptomatic area overlying the right cheek.
No mass lesion is seen in this area.  There is no adenopathy.  No
subcutaneous or skin edema is present.  There is some artifact in
the area due to dental work.

Parotid and submandibular glands are normal bilaterally.  There is
chronic sinusitis with mucosal edema opacifying the right maxillary
sinus.  No acute bony change.

Tongue base is normal.  Epiglottis and larynx are normal.  Thyroid
is normal.  Lung apices are clear.  Mild disc degeneration and
spondylosis C3-C7.  No acute bony change.
IMPRESSION: No imaging correlate is seen for the palpable area overlying the
right cheek.

Chronic sinusitis right maxillary sinus.

Negative for mass or adenopathy.

## 2014-03-27 DIAGNOSIS — N319 Neuromuscular dysfunction of bladder, unspecified: Secondary | ICD-10-CM | POA: Diagnosis not present

## 2014-03-27 DIAGNOSIS — N390 Urinary tract infection, site not specified: Secondary | ICD-10-CM | POA: Diagnosis not present

## 2014-03-27 DIAGNOSIS — K219 Gastro-esophageal reflux disease without esophagitis: Secondary | ICD-10-CM | POA: Diagnosis not present

## 2014-03-28 DIAGNOSIS — I10 Essential (primary) hypertension: Secondary | ICD-10-CM | POA: Diagnosis not present

## 2014-03-28 DIAGNOSIS — G35 Multiple sclerosis: Secondary | ICD-10-CM | POA: Diagnosis not present

## 2014-03-28 DIAGNOSIS — G8250 Quadriplegia, unspecified: Secondary | ICD-10-CM | POA: Diagnosis not present

## 2014-03-28 DIAGNOSIS — G500 Trigeminal neuralgia: Secondary | ICD-10-CM | POA: Diagnosis not present

## 2014-04-05 DIAGNOSIS — N200 Calculus of kidney: Secondary | ICD-10-CM | POA: Diagnosis not present

## 2014-07-19 DIAGNOSIS — Z23 Encounter for immunization: Secondary | ICD-10-CM | POA: Diagnosis not present

## 2014-08-09 DIAGNOSIS — Z23 Encounter for immunization: Secondary | ICD-10-CM | POA: Diagnosis not present

## 2015-02-27 DIAGNOSIS — G35 Multiple sclerosis: Secondary | ICD-10-CM | POA: Diagnosis not present

## 2015-02-27 DIAGNOSIS — G8250 Quadriplegia, unspecified: Secondary | ICD-10-CM | POA: Diagnosis not present

## 2015-02-27 DIAGNOSIS — G500 Trigeminal neuralgia: Secondary | ICD-10-CM | POA: Diagnosis not present

## 2015-02-27 DIAGNOSIS — Z79899 Other long term (current) drug therapy: Secondary | ICD-10-CM | POA: Diagnosis not present

## 2015-04-25 ENCOUNTER — Encounter: Payer: Self-pay | Admitting: Physical Therapy

## 2015-04-25 ENCOUNTER — Ambulatory Visit: Payer: Medicare Other | Attending: Neurology | Admitting: Physical Therapy

## 2015-04-25 DIAGNOSIS — M6249 Contracture of muscle, multiple sites: Secondary | ICD-10-CM | POA: Insufficient documentation

## 2015-04-25 DIAGNOSIS — M6281 Muscle weakness (generalized): Secondary | ICD-10-CM | POA: Diagnosis not present

## 2015-04-25 DIAGNOSIS — M62838 Other muscle spasm: Secondary | ICD-10-CM

## 2015-04-25 NOTE — Therapy (Signed)
[facility] [address] [place], [place], [address] Phone: [phone]   Fax:  [phone]  Physical Therapy Evaluation  Patient Details  Name: Katie Woods MRN: [id number] Date of Birth: [birth year] Referring Provider:  Beryle Beams, MD  Encounter Date: 2015-05-17      PT End of Session - 2015-05-17 2250    Visit Number 1   Number of Visits 3   Date for PT Re-Evaluation 06/24/15   Authorization Type Medicare   Authorization Time Period 05/17/15 - 06-24-15   PT Start Time 1400   PT Stop Time 1516   PT Time Calculation (min) 76 min      Past Medical History  Diagnosis Date  . Multiple sclerosis 1978  . Hypertension   . GERD (gastroesophageal reflux disease)   . Chronic constipation     Past Surgical History  Procedure Laterality Date  . Wisdom tooth extraction    . Tonsillectomy    . Suprapubic catheter    . Cholescystectomy      There were no vitals filed for this visit.  Visit Diagnosis:  Muscle weakness (generalized) - Plan: PT plan of care cert/re-cert  Muscle spasticity - Plan: PT plan of care cert/re-cert    Power wheelchair with sip and puff control and one with head control to be trialed to determine best option for pt; LMN to be completed after wheelchair trial and final recommendations have been completed                              PT Long Term Goals - 2015-05-17 2255    PT LONG TERM GOAL #1   Title Pt. will participate in trial of wheelchair controls, i.e sip & puff vs. head control, to determine best option for pt to safely operate wheelchair.   Baseline 06-24-15   Time 8   Period Weeks   Status New               Plan - 05/17/2015 02-24-2251    Clinical Impression Statement pt may be able to maneuver power wheelchair with use of head control; vendor wants to try sip and puff; both controls to be trialed to determine easiest option fo pt   Pt will benefit  from skilled therapeutic intervention in order to improve on the following deficits Decreased mobility   Rehab Potential Good   PT Frequency 1x / week   PT Duration 3 weeks   PT Treatment/Interventions Wheelchair mobility training;ADLs/Self Care Home Management;Patient/family education   PT Next Visit Plan trial of sip and puff w/chair and head control to determine appropriate control for wheelchair   Consulted and Agree with Plan of Care Patient;Family member/caregiver   Family Member Consulted husband          G-Codes - 2015-05-17 Feb 23, 2257    Functional Assessment Tool Used dependent for all mobiltiy   Functional Limitation Mobility: Walking and moving around   Mobility: Walking and Moving Around Current Status [phone]) 100 percent impaired, limited or restricted   Mobility: Walking and Moving Around Goal Status [phone]) At least 80 percent but less than 100 percent impaired, limited or restricted       Problem List Patient Active Problem List   Diagnosis Date Noted  . HTN (hypertension) 02/12/2011  . MULTIPLE SCLEROSIS, PROGRESSIVE/RELAPSING 08/27/2010  . CHEST PAIN 08/27/2010  . ELECTROCARDIOGRAM, ABNORMAL 08/27/2010    Kary Kos, PT 05/17/15, 11:04 PM  [facility] [address] [place], [place], [address] Phone: [phone]   Fax:  [phone]

## 2015-06-04 ENCOUNTER — Ambulatory Visit: Payer: Medicare Other | Admitting: Physical Therapy

## 2015-06-21 ENCOUNTER — Ambulatory Visit: Payer: Medicare Other | Admitting: Physical Therapy

## 2015-07-09 DIAGNOSIS — M818 Other osteoporosis without current pathological fracture: Secondary | ICD-10-CM | POA: Diagnosis not present

## 2015-07-09 DIAGNOSIS — R252 Cramp and spasm: Secondary | ICD-10-CM | POA: Diagnosis not present

## 2015-07-09 DIAGNOSIS — R5383 Other fatigue: Secondary | ICD-10-CM | POA: Diagnosis not present

## 2015-07-09 DIAGNOSIS — G500 Trigeminal neuralgia: Secondary | ICD-10-CM | POA: Diagnosis not present

## 2015-07-09 DIAGNOSIS — G4733 Obstructive sleep apnea (adult) (pediatric): Secondary | ICD-10-CM | POA: Diagnosis not present

## 2015-07-09 DIAGNOSIS — K219 Gastro-esophageal reflux disease without esophagitis: Secondary | ICD-10-CM | POA: Diagnosis not present

## 2015-07-09 DIAGNOSIS — E559 Vitamin D deficiency, unspecified: Secondary | ICD-10-CM | POA: Diagnosis not present

## 2015-07-09 DIAGNOSIS — G35 Multiple sclerosis: Secondary | ICD-10-CM | POA: Diagnosis not present

## 2015-07-09 DIAGNOSIS — G8252 Quadriplegia, C1-C4 incomplete: Secondary | ICD-10-CM | POA: Diagnosis not present

## 2015-07-09 DIAGNOSIS — E538 Deficiency of other specified B group vitamins: Secondary | ICD-10-CM | POA: Diagnosis not present

## 2015-07-09 DIAGNOSIS — Z79899 Other long term (current) drug therapy: Secondary | ICD-10-CM | POA: Diagnosis not present

## 2015-07-09 DIAGNOSIS — R0600 Dyspnea, unspecified: Secondary | ICD-10-CM | POA: Diagnosis not present

## 2015-07-26 ENCOUNTER — Ambulatory Visit: Payer: Medicare Other | Attending: Neurology | Admitting: Physical Therapy

## 2015-07-26 DIAGNOSIS — M6281 Muscle weakness (generalized): Secondary | ICD-10-CM | POA: Diagnosis not present

## 2015-07-26 DIAGNOSIS — M62838 Other muscle spasm: Secondary | ICD-10-CM

## 2015-07-26 DIAGNOSIS — M6249 Contracture of muscle, multiple sites: Secondary | ICD-10-CM | POA: Diagnosis not present

## 2015-07-27 ENCOUNTER — Encounter: Payer: Self-pay | Admitting: Physical Therapy

## 2015-07-27 NOTE — Therapy (Signed)
[facility] [address] [place], [place], [address] Phone: [phone]   Fax:  [phone]  Physical Therapy Treatment  Patient Details  Name: TORI DATTILIO MRN: [id number] Date of Birth: [birth year] No Data Recorded  Encounter Date: 16-Aug-2015      PT End of Session - 07/27/15 0924    Visit Number 2   Number of Visits 3   Date for PT Re-Evaluation 07/24/15   Authorization Type Medicare   Authorization Time Period 04-25-15 - 06-24-15   PT Start Time 1318   PT Stop Time 1407   PT Time Calculation (min) 49 min      Past Medical History  Diagnosis Date  . Multiple sclerosis ([place]) 1978  . Hypertension   . GERD (gastroesophageal reflux disease)   . Chronic constipation     Past Surgical History  Procedure Laterality Date  . Wisdom tooth extraction    . Tonsillectomy    . Suprapubic catheter    . Cholescystectomy      There were no vitals filed for this visit.  Visit Diagnosis:  Muscle weakness (generalized)  Muscle spasticity      Subjective Assessment - 07/27/15 0922    Subjective pt ready to trial sip and puff wheelchair - accompanied to PT by husband with wheelchair vendor present   Patient is accompained by: Family member  husband and w/c vendor   Currently in Pain? No/denies       Wheelchair management - sip and puff set up on a trial power wheelchair; pt was transferred from her wheelchair into [place] with sip and puff -- pt performed wheelchair propulsion forward via sipping and practiced moving chair in reverse with  Puffing -- pt practiced maneuvering wheelchair in clinic gym with wheelchair vendor Tad Moore, [employer] from [employer] for instruction and used a kill switch attached to chair for safety and to assist pt in maneuvering and operating power Wheelchair with this sip & puff device                               PT Long Term  Goals - 07/27/15 0927    PT LONG TERM GOAL #1   Title Pt. will participate in trial of wheelchair controls, i.e sip & puff vs. head control, to determine best option for pt to safely operate wheelchair.   Baseline met 08/16/15   Status Achieved               Plan - 07/27/15 0925    Clinical Impression Statement pt able to safely use sip and puff for maneuvering power wheelchair - will need some more practice but pt able to safely propel wheelchair with use of sip & puff in clinic   Pt will benefit from skilled therapeutic intervention in order to improve on the following deficits Decreased mobility   Rehab Potential Good   PT Frequency 1x / week   PT Duration 3 weeks   PT Treatment/Interventions Wheelchair mobility training;ADLs/Self Care Home Management;Patient/family education   PT Next Visit Plan trial of sip and puff w/chair and head control to determine appropriate control for wheelchair   Consulted and Agree with Plan of Care Patient;Family member/caregiver;Other (Comment)  wheelchair vendor Tad Moore present   Family Member Consulted husband          G-Codes - August 16, 2015 0102    Functional Assessment Tool Used Clinical judgment  Functional Limitation Dependent for all mobility   Mobility: Walking and Moving Around Goal Status [phone]) CM   Mobility: Walking and Moving Around Discharge Status [phone]) CM      Problem List Patient Active Problem List   Diagnosis Date Noted  . HTN (hypertension) 02/12/2011  . MULTIPLE SCLEROSIS, PROGRESSIVE/RELAPSING 08/27/2010  . CHEST PAIN 08/27/2010  . ELECTROCARDIOGRAM, ABNORMAL 08/27/2010    Alda Lea, PT 07/27/2015, 9:31 AM  [facility] [address] [place], [place], [address] Phone: [phone]   Fax:  [phone]  Name: TANGANIKA BARRADAS MRN: [id number] Date of Birth: May 17, 1946

## 2015-08-14 DIAGNOSIS — G35 Multiple sclerosis: Secondary | ICD-10-CM | POA: Diagnosis not present

## 2015-08-14 DIAGNOSIS — G4730 Sleep apnea, unspecified: Secondary | ICD-10-CM | POA: Diagnosis not present

## 2015-08-14 DIAGNOSIS — R0600 Dyspnea, unspecified: Secondary | ICD-10-CM | POA: Diagnosis not present

## 2015-09-03 DIAGNOSIS — G4733 Obstructive sleep apnea (adult) (pediatric): Secondary | ICD-10-CM | POA: Diagnosis not present

## 2015-09-03 DIAGNOSIS — G35 Multiple sclerosis: Secondary | ICD-10-CM | POA: Diagnosis not present

## 2015-09-03 DIAGNOSIS — I10 Essential (primary) hypertension: Secondary | ICD-10-CM | POA: Diagnosis not present

## 2015-09-03 DIAGNOSIS — G8250 Quadriplegia, unspecified: Secondary | ICD-10-CM | POA: Diagnosis not present

## 2015-09-03 DIAGNOSIS — G500 Trigeminal neuralgia: Secondary | ICD-10-CM | POA: Diagnosis not present

## 2015-09-03 DIAGNOSIS — R0600 Dyspnea, unspecified: Secondary | ICD-10-CM | POA: Diagnosis not present

## 2015-09-05 DIAGNOSIS — J449 Chronic obstructive pulmonary disease, unspecified: Secondary | ICD-10-CM | POA: Diagnosis not present

## 2015-09-12 DIAGNOSIS — Z23 Encounter for immunization: Secondary | ICD-10-CM | POA: Diagnosis not present

## 2015-09-20 DIAGNOSIS — I10 Essential (primary) hypertension: Secondary | ICD-10-CM | POA: Diagnosis not present

## 2015-09-20 DIAGNOSIS — G8250 Quadriplegia, unspecified: Secondary | ICD-10-CM | POA: Diagnosis not present

## 2015-09-20 DIAGNOSIS — R2689 Other abnormalities of gait and mobility: Secondary | ICD-10-CM | POA: Diagnosis not present

## 2015-09-20 DIAGNOSIS — G35 Multiple sclerosis: Secondary | ICD-10-CM | POA: Diagnosis not present

## 2015-10-03 ENCOUNTER — Other Ambulatory Visit (HOSPITAL_COMMUNITY): Payer: Self-pay | Admitting: Respiratory Therapy

## 2015-10-03 DIAGNOSIS — I10 Essential (primary) hypertension: Secondary | ICD-10-CM

## 2015-10-03 DIAGNOSIS — G500 Trigeminal neuralgia: Secondary | ICD-10-CM

## 2015-10-03 DIAGNOSIS — G8250 Quadriplegia, unspecified: Secondary | ICD-10-CM

## 2015-10-03 DIAGNOSIS — G4733 Obstructive sleep apnea (adult) (pediatric): Secondary | ICD-10-CM

## 2015-10-03 DIAGNOSIS — G35 Multiple sclerosis: Secondary | ICD-10-CM

## 2015-10-03 DIAGNOSIS — R0600 Dyspnea, unspecified: Secondary | ICD-10-CM

## 2018-06-01 ENCOUNTER — Encounter (HOSPITAL_BASED_OUTPATIENT_CLINIC_OR_DEPARTMENT_OTHER): Payer: Medicare Other | Attending: Internal Medicine

## 2018-06-01 DIAGNOSIS — G35 Multiple sclerosis: Secondary | ICD-10-CM | POA: Diagnosis not present

## 2018-06-01 DIAGNOSIS — G4730 Sleep apnea, unspecified: Secondary | ICD-10-CM | POA: Diagnosis not present

## 2018-06-01 DIAGNOSIS — L89153 Pressure ulcer of sacral region, stage 3: Secondary | ICD-10-CM | POA: Insufficient documentation

## 2018-06-15 ENCOUNTER — Encounter (HOSPITAL_BASED_OUTPATIENT_CLINIC_OR_DEPARTMENT_OTHER): Payer: Medicare Other | Attending: Internal Medicine

## 2018-06-15 DIAGNOSIS — G35 Multiple sclerosis: Secondary | ICD-10-CM | POA: Diagnosis not present

## 2018-06-15 DIAGNOSIS — G4733 Obstructive sleep apnea (adult) (pediatric): Secondary | ICD-10-CM | POA: Insufficient documentation

## 2018-06-15 DIAGNOSIS — L89153 Pressure ulcer of sacral region, stage 3: Secondary | ICD-10-CM | POA: Diagnosis present

## 2018-06-30 DIAGNOSIS — L89153 Pressure ulcer of sacral region, stage 3: Secondary | ICD-10-CM | POA: Diagnosis not present

## 2018-07-20 ENCOUNTER — Encounter (HOSPITAL_BASED_OUTPATIENT_CLINIC_OR_DEPARTMENT_OTHER): Payer: Medicare Other | Attending: Internal Medicine

## 2018-07-20 DIAGNOSIS — L89154 Pressure ulcer of sacral region, stage 4: Secondary | ICD-10-CM | POA: Diagnosis present

## 2018-07-20 DIAGNOSIS — G35 Multiple sclerosis: Secondary | ICD-10-CM | POA: Insufficient documentation

## 2018-07-20 DIAGNOSIS — G4730 Sleep apnea, unspecified: Secondary | ICD-10-CM | POA: Diagnosis not present

## 2019-06-24 ENCOUNTER — Other Ambulatory Visit: Payer: Self-pay

## 2019-06-24 ENCOUNTER — Encounter (HOSPITAL_COMMUNITY): Payer: Self-pay | Admitting: Emergency Medicine

## 2019-06-24 ENCOUNTER — Emergency Department (HOSPITAL_COMMUNITY): Payer: Medicare Other

## 2019-06-24 ENCOUNTER — Inpatient Hospital Stay (HOSPITAL_COMMUNITY)
Admission: EM | Admit: 2019-06-24 | Discharge: 2019-06-28 | DRG: 570 | Disposition: A | Discharge status: Home Health | Payer: Medicare Other | Attending: Family Medicine | Admitting: Family Medicine

## 2019-06-24 DIAGNOSIS — L03317 Cellulitis of buttock: Secondary | ICD-10-CM | POA: Diagnosis present

## 2019-06-24 DIAGNOSIS — Z993 Dependence on wheelchair: Secondary | ICD-10-CM | POA: Diagnosis not present

## 2019-06-24 DIAGNOSIS — G35 Multiple sclerosis: Secondary | ICD-10-CM | POA: Diagnosis present

## 2019-06-24 DIAGNOSIS — Z9049 Acquired absence of other specified parts of digestive tract: Secondary | ICD-10-CM | POA: Diagnosis not present

## 2019-06-24 DIAGNOSIS — R627 Adult failure to thrive: Secondary | ICD-10-CM | POA: Diagnosis present

## 2019-06-24 DIAGNOSIS — N390 Urinary tract infection, site not specified: Secondary | ICD-10-CM | POA: Diagnosis not present

## 2019-06-24 DIAGNOSIS — G500 Trigeminal neuralgia: Secondary | ICD-10-CM | POA: Diagnosis present

## 2019-06-24 DIAGNOSIS — Z20828 Contact with and (suspected) exposure to other viral communicable diseases: Secondary | ICD-10-CM | POA: Diagnosis present

## 2019-06-24 DIAGNOSIS — I10 Essential (primary) hypertension: Secondary | ICD-10-CM | POA: Diagnosis present

## 2019-06-24 DIAGNOSIS — Z79899 Other long term (current) drug therapy: Secondary | ICD-10-CM | POA: Diagnosis not present

## 2019-06-24 DIAGNOSIS — Z7401 Bed confinement status: Secondary | ICD-10-CM

## 2019-06-24 DIAGNOSIS — K219 Gastro-esophageal reflux disease without esophagitis: Secondary | ICD-10-CM | POA: Diagnosis present

## 2019-06-24 DIAGNOSIS — Z66 Do not resuscitate: Secondary | ICD-10-CM | POA: Diagnosis present

## 2019-06-24 DIAGNOSIS — L89314 Pressure ulcer of right buttock, stage 4: Secondary | ICD-10-CM | POA: Diagnosis present

## 2019-06-24 DIAGNOSIS — Z882 Allergy status to sulfonamides status: Secondary | ICD-10-CM | POA: Diagnosis not present

## 2019-06-24 DIAGNOSIS — G8250 Quadriplegia, unspecified: Secondary | ICD-10-CM | POA: Diagnosis present

## 2019-06-24 DIAGNOSIS — N319 Neuromuscular dysfunction of bladder, unspecified: Secondary | ICD-10-CM | POA: Diagnosis present

## 2019-06-24 DIAGNOSIS — L89154 Pressure ulcer of sacral region, stage 4: Secondary | ICD-10-CM | POA: Diagnosis present

## 2019-06-24 DIAGNOSIS — L89150 Pressure ulcer of sacral region, unstageable: Secondary | ICD-10-CM | POA: Diagnosis present

## 2019-06-24 DIAGNOSIS — Z883 Allergy status to other anti-infective agents status: Secondary | ICD-10-CM | POA: Diagnosis not present

## 2019-06-24 DIAGNOSIS — K5909 Other constipation: Secondary | ICD-10-CM | POA: Diagnosis present

## 2019-06-24 DIAGNOSIS — Z8249 Family history of ischemic heart disease and other diseases of the circulatory system: Secondary | ICD-10-CM

## 2019-06-24 DIAGNOSIS — E46 Unspecified protein-calorie malnutrition: Secondary | ICD-10-CM

## 2019-06-24 DIAGNOSIS — L089 Local infection of the skin and subcutaneous tissue, unspecified: Secondary | ICD-10-CM

## 2019-06-24 HISTORY — DX: Paralytic syndrome, unspecified: G83.9

## 2019-06-24 HISTORY — DX: Trigeminal neuralgia: G50.0

## 2019-06-24 LAB — CBC WITH DIFFERENTIAL/PLATELET
Abs Immature Granulocytes: 0.04 10*3/uL (ref 0.00–0.07)
Basophils Absolute: 0 10*3/uL (ref 0.0–0.1)
Basophils Relative: 0 %
Eosinophils Absolute: 0.1 10*3/uL (ref 0.0–0.5)
Eosinophils Relative: 1 %
HCT: 38.5 % (ref 36.0–46.0)
Hemoglobin: 11.8 g/dL — ABNORMAL LOW (ref 12.0–15.0)
Immature Granulocytes: 1 %
Lymphocytes Relative: 20 %
Lymphs Abs: 1.4 10*3/uL (ref 0.7–4.0)
MCH: 30 pg (ref 26.0–34.0)
MCHC: 30.6 g/dL (ref 30.0–36.0)
MCV: 98 fL (ref 80.0–100.0)
Monocytes Absolute: 0.5 10*3/uL (ref 0.1–1.0)
Monocytes Relative: 7 %
Neutro Abs: 5.3 10*3/uL (ref 1.7–7.7)
Neutrophils Relative %: 71 %
Platelets: 425 10*3/uL — ABNORMAL HIGH (ref 150–400)
RBC: 3.93 MIL/uL (ref 3.87–5.11)
RDW: 13.4 % (ref 11.5–15.5)
WBC: 7.4 10*3/uL (ref 4.0–10.5)
nRBC: 0 % (ref 0.0–0.2)

## 2019-06-24 LAB — BASIC METABOLIC PANEL
Anion gap: 11 (ref 5–15)
BUN: 22 mg/dL (ref 8–23)
CO2: 30 mmol/L (ref 22–32)
Calcium: 8.7 mg/dL — ABNORMAL LOW (ref 8.9–10.3)
Chloride: 95 mmol/L — ABNORMAL LOW (ref 98–111)
Creatinine, Ser: 0.36 mg/dL — ABNORMAL LOW (ref 0.44–1.00)
GFR calc Af Amer: >60 mL/min (ref >60)
GFR calc non Af Amer: >60 mL/min (ref >60)
Glucose, Bld: 112 mg/dL — ABNORMAL HIGH (ref 70–99)
Potassium: 4.2 mmol/L (ref 3.5–5.1)
Sodium: 136 mmol/L (ref 135–145)

## 2019-06-24 LAB — SEDIMENTATION RATE: Sed Rate: 60 mm/hr — ABNORMAL HIGH (ref 0–22)

## 2019-06-24 LAB — C-REACTIVE PROTEIN: CRP: 9 mg/dL — ABNORMAL HIGH (ref <1.0)

## 2019-06-24 MED ORDER — COLLAGENASE 250 UNIT/GM EX OINT
1.0000 "application " | TOPICAL_OINTMENT | Freq: Every day | CUTANEOUS | Status: DC
  Administered 2019-06-25 – 2019-06-28 (×4): 1 via TOPICAL
  Filled 2019-06-24: qty 30

## 2019-06-24 MED ORDER — ONDANSETRON HCL 4 MG/2ML IJ SOLN: 4.0000 mg | Freq: Four times a day (QID) | INTRAMUSCULAR | Status: DC | PRN

## 2019-06-24 MED ORDER — PREGABALIN 50 MG PO CAPS
100.0000 mg | ORAL_CAPSULE | Freq: Two times a day (BID) | ORAL | Status: DC
  Administered 2019-06-25 – 2019-06-28 (×8): 100 mg via ORAL
  Filled 2019-06-24 (×8): qty 2

## 2019-06-24 MED ORDER — MORPHINE SULFATE (PF) 4 MG/ML IV SOLN
4.0000 mg | INTRAVENOUS | Status: DC | PRN
  Administered 2019-06-25 (×4): 4 mg via INTRAVENOUS
  Filled 2019-06-24 (×4): qty 1

## 2019-06-24 MED ORDER — ENOXAPARIN SODIUM 40 MG/0.4ML ~~LOC~~ SOLN
40.0000 mg | SUBCUTANEOUS | Status: DC
  Administered 2019-06-25: 40 mg via SUBCUTANEOUS
  Filled 2019-06-24: qty 0.4

## 2019-06-24 MED ORDER — CEPHALEXIN 500 MG PO CAPS
500.0000 mg | ORAL_CAPSULE | Freq: Four times a day (QID) | ORAL | Status: DC
  Administered 2019-06-25 (×4): 500 mg via ORAL
  Filled 2019-06-24 (×4): qty 1

## 2019-06-24 MED ORDER — ONDANSETRON HCL 4 MG PO TABS: 4.0000 mg | ORAL_TABLET | Freq: Four times a day (QID) | ORAL | Status: DC | PRN

## 2019-06-24 MED ORDER — SERTRALINE HCL 50 MG PO TABS
100.0000 mg | ORAL_TABLET | Freq: Every day | ORAL | Status: DC
  Administered 2019-06-25 – 2019-06-28 (×4): 100 mg via ORAL
  Filled 2019-06-24 (×4): qty 2

## 2019-06-24 MED ORDER — PANTOPRAZOLE SODIUM 40 MG PO TBEC
40.0000 mg | DELAYED_RELEASE_TABLET | Freq: Every day | ORAL | Status: DC
  Administered 2019-06-25 – 2019-06-28 (×4): 40 mg via ORAL
  Filled 2019-06-24 (×4): qty 1

## 2019-06-24 MED ORDER — ACETAMINOPHEN 325 MG PO TABS: 650.0000 mg | ORAL_TABLET | Freq: Four times a day (QID) | ORAL | Status: DC | PRN

## 2019-06-24 MED ORDER — CARBAMAZEPINE ER 100 MG PO TB12
300.0000 mg | ORAL_TABLET | Freq: Every day | ORAL | Status: DC
  Administered 2019-06-25 – 2019-06-27 (×4): 300 mg via ORAL
  Filled 2019-06-24 (×4): qty 3

## 2019-06-24 MED ORDER — FERROUS SULFATE 325 (65 FE) MG PO TABS
325.0000 mg | ORAL_TABLET | Freq: Three times a day (TID) | ORAL | Status: DC
  Administered 2019-06-25 – 2019-06-28 (×10): 325 mg via ORAL
  Filled 2019-06-24 (×10): qty 1

## 2019-06-24 MED ORDER — VITAMIN E 45 MG (100 UNIT) PO CAPS
600.0000 [IU] | ORAL_CAPSULE | Freq: Every day | ORAL | Status: DC
  Administered 2019-06-25: 600 [IU] via ORAL
  Filled 2019-06-24 (×3): qty 2

## 2019-06-24 MED ORDER — VITAMIN C 500 MG PO TABS
500.0000 mg | ORAL_TABLET | Freq: Every day | ORAL | Status: DC
  Administered 2019-06-25 – 2019-06-27 (×4): 500 mg via ORAL
  Filled 2019-06-24 (×4): qty 1

## 2019-06-24 MED ORDER — DOCUSATE SODIUM 100 MG PO CAPS
100.0000 mg | ORAL_CAPSULE | Freq: Every day | ORAL | Status: DC
  Administered 2019-06-25: 100 mg via ORAL
  Filled 2019-06-24: qty 1

## 2019-06-24 MED ORDER — TRAMADOL HCL 50 MG PO TABS
50.0000 mg | ORAL_TABLET | Freq: Four times a day (QID) | ORAL | Status: DC | PRN
  Administered 2019-06-26: 50 mg via ORAL
  Filled 2019-06-24: qty 1

## 2019-06-24 MED ORDER — METOPROLOL TARTRATE 50 MG PO TABS
50.0000 mg | ORAL_TABLET | Freq: Every day | ORAL | Status: DC
  Administered 2019-06-25 – 2019-06-28 (×4): 50 mg via ORAL
  Filled 2019-06-24 (×4): qty 1

## 2019-06-24 MED ORDER — VITAMIN D 25 MCG (1000 UNIT) PO TABS
5000.0000 [IU] | ORAL_TABLET | Freq: Every day | ORAL | Status: DC
  Administered 2019-06-25 – 2019-06-27 (×3): 5000 [IU] via ORAL
  Filled 2019-06-24 (×3): qty 5

## 2019-06-24 MED ORDER — ACETAMINOPHEN 650 MG RE SUPP: 650.0000 mg | Freq: Four times a day (QID) | RECTAL | Status: DC | PRN

## 2019-06-24 MED ORDER — DARIFENACIN HYDROBROMIDE ER 7.5 MG PO TB24
7.5000 mg | ORAL_TABLET | Freq: Every day | ORAL | Status: DC
  Administered 2019-06-25 – 2019-06-28 (×4): 7.5 mg via ORAL
  Filled 2019-06-24 (×4): qty 1

## 2019-06-24 MED ORDER — INTERFERON BETA-1A 30 MCG/0.5ML IM PSKT
30.0000 ug | PREFILLED_SYRINGE | INTRAMUSCULAR | Status: DC
  Filled 2019-06-24: qty 0.5

## 2019-06-24 NOTE — ED Triage Notes (Signed)
Wound on buttocks bigger than quarter size with foul smelling drainage  that started x 2 weeks ago. Other areas have came up near the wound that look the same.  Pt is confined to wheelchair.

## 2019-06-24 NOTE — ED Provider Notes (Addendum)
[facility] EMERGENCY DEPARTMENT Provider Note   CSN: [id number] Arrival date & time: 06/24/19  1646     History   Chief Complaint Chief Complaint  Patient presents with  . Wound Infection    HPI Katie Woods is a 73 y.o. female.     HPI  73 year old comes in a chief complaint of wound infection. Patient has history of MS and is bedbound.  She is brought here by her husband with complaints of new wound to her left buttock.  According to patient's husband, patient was sick back in January of this year.  When she got well she stopped requesting to be moved and they stopped doing it.  Over the past 2 weeks they noticed multiple wound opened up.  The first wound had opened up few days ago and patient is supposed to see wound care team on Tuesday.  There is foul odor from the lesions.  The wife reports that she does have some discomfort at that site and that the discomfort has been present for several days. Review of system is positive for night sweats, weakness.  No chills.  Appetite has been normal.  Past Medical History:  Diagnosis Date  . Chronic constipation   . GERD (gastroesophageal reflux disease)   . Hypertension   . Multiple sclerosis ([place]) 1978  . Trigeminal neuralgia     Patient Active Problem List   Diagnosis Date Noted  . HTN (hypertension) 02/12/2011  . MULTIPLE SCLEROSIS, PROGRESSIVE/RELAPSING 08/27/2010  . CHEST PAIN 08/27/2010  . ELECTROCARDIOGRAM, ABNORMAL 08/27/2010    Past Surgical History:  Procedure Laterality Date  . cholescystectomy    . suprapubic catheter    . TONSILLECTOMY    . WISDOM TOOTH EXTRACTION       OB History   No obstetric history on file.      Home Medications    Prior to Admission medications   Medication Sig Start Date End Date Taking? Authorizing Provider  AVONEX PEN 30 MCG/0.5ML AJKT Inject 30 mcg as directed once a week. Every tuesday 04/25/19  Yes [provider]  carbamazepine (CARBATROL) 300 MG 12 hr  capsule Take 300 mg by mouth 2 (two) times daily. 200mg  in the afternoon, 300mg  at bedtime.   Yes [provider]  collagenase (SANTYL) ointment Apply 1 application topically daily.   Yes [provider]  ferrous sulfate 325 (65 FE) MG tablet Take 325 mg by mouth 3 (three) times daily with meals.     Yes [provider]  L-Lysine 500 MG TABS Take by mouth.     Yes [provider]  metoprolol tartrate (LOPRESSOR) 50 MG tablet Take 50 mg by mouth daily.   Yes [provider]  pantoprazole (PROTONIX) 40 MG tablet Take 40 mg by mouth. hs   Yes [provider]  Polyethyl Glyc-Propyl Glyc PF (SYSTANE ULTRA PF) 0.4-0.3 % SOLN Apply 1 drop to eye 2 (two) times daily. Both eyes each morning and evening.   Yes [provider]  pregabalin (LYRICA) 100 MG capsule Take 1 tablet by mouth 2 (two) times daily.   Yes [provider]  sertraline (ZOLOFT) 100 MG tablet Take 100 mg by mouth daily.     Yes [provider]  solifenacin (VESICARE) 10 MG tablet Take 5 mg by mouth daily.     Yes [provider]  Turmeric (QC TUMERIC COMPLEX) 500 MG CAPS Take 1 tablet by mouth 2 (two) times daily.   Yes [provider]  vitamin C (ASCORBIC ACID) 500 MG tablet Take 500 mg by mouth at bedtime.   Yes [provider]  Vitamin D, Cholecalciferol, 25 MCG (1000 UT) TABS Take 5 tablets by mouth at bedtime.   Yes [provider]  vitamin E 600 UNIT capsule Take 600 Units by mouth daily.     Yes [provider]  acetaminophen (TYLENOL) 500 MG tablet Take 500 mg by mouth. 6 TIMES A DAY     [provider]  metoprolol (LOPRESSOR) 50 MG tablet Take 1 tablet (50 mg total) by mouth 2 (two) times daily. Patient not taking: Reported on 06/24/2019 06/13/11 06/12/12  Rollene RotundaHochrein, James, MD    Family History Family History  Problem Relation Age of Onset  . Heart attack Mother   . Diabetes Mother   . Heart  attack Father     Social History Social History   Tobacco Use  . Smoking status: Never Smoker  . Smokeless tobacco: Never Used  Substance Use Topics  . Alcohol use: No  . Drug use: Never     Allergies   Sulfonamide derivatives and Macrodantin [nitrofurantoin macrocrystal]   Review of Systems Review of Systems  Constitutional: Positive for activity change.  Skin: Positive for rash and wound.  Allergic/Immunologic: Negative for immunocompromised state.  Hematological: Does not bruise/bleed easily.  All other systems reviewed and are negative.    Physical Exam Updated Vital Signs BP (!) 121/48 (BP Location: Right Arm)   Pulse 91   Temp 98.3 F (36.8 C) (Oral)   Resp 15   Wt 48.1 kg   SpO2 96%   BMI 19.39 kg/m   Physical Exam Vitals signs and nursing note reviewed.  Constitutional:      Appearance: She is well-developed.  HENT:     Head: Normocephalic.  Neck:     Musculoskeletal: Normal range of motion.  Cardiovascular:     Rate and Rhythm: Normal rate.  Pulmonary:     Effort: Pulmonary effort is normal.  Abdominal:     General: Bowel sounds are normal.  Musculoskeletal:     Comments: Multiple ulcerated wound on the R gluteal region, foul smell and ulceration  Skin:    General: Skin is warm and dry.  Neurological:     Mental Status: She is alert.      ED Treatments / Results  Labs (all labs ordered are listed, but only abnormal results are displayed) Labs Reviewed  BASIC METABOLIC PANEL - Abnormal; Notable for the following components:      Result Value   Chloride 95 (*)    Glucose, Bld 112 (*)    Creatinine, Ser 0.36 (*)    Calcium 8.7 (*)    All other components within normal limits  CBC WITH DIFFERENTIAL/PLATELET - Abnormal; Notable for the following components:   Hemoglobin 11.8 (*)    Platelets 425 (*)    All other components within normal limits  SEDIMENTATION RATE - Abnormal; Notable for the following components:   Sed Rate 60 (*)     All other components within normal limits  C-REACTIVE PROTEIN - Abnormal; Notable for the following components:   CRP 9.0 (*)    All other components within normal limits  CULTURE, BLOOD (ROUTINE X 2)  SARS CORONAVIRUS 2 (TAT 6-24 HRS)    EKG None  Radiology Dg Pelvis 1-2 Views  Result Date: 06/24/2019 CLINICAL DATA:  Left hip pressure ulcers EXAM: PELVIS - 1-2 VIEW COMPARISON:  Is CT urogram 08/26/2017  FINDINGS: The bones are diffusely demineralized. No radiographically evident ulceration. There is diffuse soft tissue edema of the thighs. No radiopaque marker is identified. No erosion, destructive change or immature periostitis to suggest radiographic features of osteomyelitis. Degenerative changes are noted in the SI joints, both hips and minimally within the symphysis pubis. Bowel gas pattern is unremarkable. IMPRESSION: Diffuse soft tissue edema of the thighs without radiographic evidence of osteomyelitis. Please note a radiopaque marker to indicate the site ulceration is not visualized on this exam. Possibly beyond the collimation. Electronically Signed   By: Kreg ShropshirePrice  DeHay M.D.   On: 06/24/2019 20:23    Procedures Procedures (including critical care time)  Medications Ordered in ED Medications - No data to display   Initial Impression / Assessment and Plan / ED Course  I have reviewed the triage vital signs and the nursing notes.  Pertinent labs & imaging results that were available during my care of the patient were reviewed by me and considered in my medical decision making (see chart for details).  Clinical Course as of Jun 23 2049  Fri Jun 24, 2019  2014 CRP is over 5 and sed rate over 60, osteomyelitis will be needed to be ruled out. Will not start any antibiotics.  Clinically patient is not septic.  She will also benefit with wound care and social work consult.  CRP(!): 9.0 [AN]    Clinical Course User Index [AN] Derwood KaplanNanavati, Mckade Gurka, MD        Patient comes in a chief  complaint of wound infection. She has multiple ulcerated wounds to be right gluteal region, with necrosis and foul smell.  The pressure ulcers will need debridement.  She has an appointment with wound care coming up in 4 days.  There is no surrounding cellulitis, however she is having subjective fevers, night sweats -therefore there could be an evolving infection in the deep space.  All the wound opened up within the last few days which also likely means that she had a developing ulcer for a long time, but the skin breakdown just occurred recently.  We will get basic labs and reassess.  If both patient's sed rate and CRP are significantly elevated then we will admit her for osteomyelitis work-up as well.  Her husband is overwhelmed, it seems like likely she will need admission for optimization and wound care can be begun so that she does not end up with complications.   Final Clinical Impressions(s) / ED Diagnoses   Final diagnoses:  Pressure injury of right buttock, stage 4 (HCC)  Wound infection  Protein malnutrition [facility](HCC)    ED Discharge Orders    None         Derwood KaplanNanavati, Rendi Mapel, MD 06/24/19 2050

## 2019-06-24 NOTE — Plan of Care (Signed)
?  Problem: Clinical Measurements: ?Goal: Will remain free from infection ?Outcome: Not Progressing ?  ?Problem: Clinical Measurements: ?Goal: Diagnostic test results will improve ?Outcome: Not Progressing ?  ?Problem: Activity: ?Goal: Risk for activity intolerance will decrease ?Outcome: Not Progressing ?  ?Problem: Nutrition: ?Goal: Adequate nutrition will be maintained ?Outcome: Not Progressing ?  ?

## 2019-06-24 NOTE — ED Notes (Signed)
ED TO INPATIENT HANDOFF REPORT  ED Nurse Name and Phone #:  Nira Retort [phone]  S Name/Age/Gender Katie Woods 73 y.o. female Room/Bed: APOTF/OTF  Code Status   Code Status: DNR  Home/SNF/Other Nursing Home Patient oriented to: self, place, time and situation Is this baseline? Yes   Triage Complete: Triage complete  Chief Complaint Pressure Sores  Triage Note Wound on buttocks bigger than quarter size with foul smelling drainage  that started x 2 weeks ago. Other areas have came up near the wound that look the same.  Pt is confined to wheelchair.     Allergies Allergies  Allergen Reactions  . Sulfonamide Derivatives   . Macrodantin [Nitrofurantoin Macrocrystal] Rash    Level of Care/Admitting Diagnosis ED Disposition    ED Disposition Condition Comment   Admit  Hospital Area: [facility] [[id number]]  Level of Care: Med-Surg [16]  Covid Evaluation: Asymptomatic Screening Protocol (No Symptoms)  Diagnosis: Sacral decubitus ulcer, stage IV [facility]) [[id number]]  Admitting Physician: Lilyan Gilford [[id number]]  Attending Physician: Lilyan Gilford [[id number]]  Estimated length of stay: past midnight tomorrow  Certification:: I certify this patient will need inpatient services for at least 2 midnights  PT Class (Do Not Modify): Inpatient [101]  PT Acc Code (Do Not Modify): Private [1]       B Medical/Surgery History Past Medical History:  Diagnosis Date  . Chronic constipation   . GERD (gastroesophageal reflux disease)   . Hypertension   . Multiple sclerosis (HCC) 1978  . Trigeminal neuralgia    Past Surgical History:  Procedure Laterality Date  . cholescystectomy    . suprapubic catheter    . TONSILLECTOMY    . WISDOM TOOTH EXTRACTION       A IV Location/Drains/Wounds Patient Lines/Drains/Airways Status   Active Line/Drains/Airways    Name:   Placement date:   Placement time:   Site:   Days:   Peripheral IV 06/24/19 Right Forearm    06/24/19    1846    Forearm   less than 1   Pressure Injury 06/24/19 Buttocks Left Stage IV - Full thickness tissue loss with exposed bone, tendon or muscle. 2.6 x 3.8 W.  8-12 tunnelling, undermining at 12.   06/24/19    1730     less than 1   Pressure Injury 06/24/19 Buttocks Left Stage IV - Full thickness tissue loss with exposed bone, tendon or muscle. upper buttocks   06/24/19    1734     less than 1   Pressure Injury 06/24/19 Buttocks Right Deep Tissue Injury - Purple or maroon localized area of discolored intact skin or blood-filled blister due to damage of underlying soft tissue from pressure and/or shear.   06/24/19    1739     less than 1   Pressure Injury 06/24/19 Sacrum skin tear   06/24/19    1742     less than 1          Intake/Output Last 24 hours No intake or output data in the 24 hours ending 06/24/19 2300  Labs/Imaging Results for orders placed or performed during the hospital encounter of 06/24/19 (from the past 48 hour(s))  Basic metabolic panel     Status: Abnormal   Collection Time: 06/24/19  6:15 PM  Result Value Ref Range   Sodium 136 135 - 145 mmol/L   Potassium 4.2 3.5 - 5.1 mmol/L   Chloride 95 (L) 98 - 111 mmol/L   CO2 30  22 - 32 mmol/L   Glucose, Bld 112 (H) 70 - 99 mg/dL   BUN 22 8 - 23 mg/dL   Creatinine, Ser 0.36 (L) 0.44 - 1.00 mg/dL   Calcium 8.7 (L) 8.9 - 10.3 mg/dL   GFR calc non Af Amer >60 >60 mL/min   GFR calc Af Amer >60 >60 mL/min   Anion gap 11 5 - 15    Comment: Performed at [facility], [address], [place], [place] [address]  CBC with Differential/Platelet     Status: Abnormal   Collection Time: 06/24/19  6:15 PM  Result Value Ref Range   WBC 7.4 4.0 - 10.5 K/uL   RBC 3.93 3.87 - 5.11 MIL/uL   Hemoglobin 11.8 (L) 12.0 - 15.0 g/dL   HCT 38.5 36.0 - 46.0 %   MCV 98.0 80.0 - 100.0 fL   MCH 30.0 26.0 - 34.0 pg   MCHC 30.6 30.0 - 36.0 g/dL   RDW 13.4 11.5 - 15.5 %   Platelets 425 (H) 150 - 400 K/uL   nRBC 0.0 0.0 - 0.2 %    Neutrophils Relative % 71 %   Neutro Abs 5.3 1.7 - 7.7 K/uL   Lymphocytes Relative 20 %   Lymphs Abs 1.4 0.7 - 4.0 K/uL   Monocytes Relative 7 %   Monocytes Absolute 0.5 0.1 - 1.0 K/uL   Eosinophils Relative 1 %   Eosinophils Absolute 0.1 0.0 - 0.5 K/uL   Basophils Relative 0 %   Basophils Absolute 0.0 0.0 - 0.1 K/uL   Immature Granulocytes 1 %   Abs Immature Granulocytes 0.04 0.00 - 0.07 K/uL    Comment: Performed at [facility], [address] [place], [place] [address]  Culture, blood (routine x 2)     Status: None (Preliminary result)   Collection Time: 06/24/19  6:15 PM   Specimen: BLOOD LEFT WRIST  Result Value Ref Range   Specimen Description BLOOD LEFT WRIST    Special Requests      BOTTLES DRAWN AEROBIC AND ANAEROBIC Blood Culture adequate volume Performed at [facility], [address], [place], [place] [address]    Culture PENDING    Report Status PENDING   Sedimentation rate     Status: Abnormal   Collection Time: 06/24/19  6:15 PM  Result Value Ref Range   Sed Rate 60 (H) 0 - 22 mm/hr    Comment: Performed at [facility], [address], [place], [place] [address]  C-reactive protein     Status: Abnormal   Collection Time: 06/24/19  6:15 PM  Result Value Ref Range   CRP 9.0 (H) <1.0 mg/dL    Comment: Performed at [facility], [address], [place], [place] [address]   Dg Pelvis 1-2 Views  Result Date: 06/24/2019 CLINICAL DATA:  Left hip pressure ulcers EXAM: PELVIS - 1-2 VIEW COMPARISON:  Is CT urogram 08/26/2017 FINDINGS: The bones are diffusely demineralized. No radiographically evident ulceration. There is diffuse soft tissue edema of the thighs. No radiopaque marker is identified. No erosion, destructive change or immature periostitis to suggest radiographic features of osteomyelitis. Degenerative changes are noted in the SI joints, both hips and minimally within the symphysis pubis. Bowel gas pattern is unremarkable. IMPRESSION: Diffuse soft tissue edema of  the thighs without radiographic evidence of osteomyelitis. Please note a radiopaque marker to indicate the site ulceration is not visualized on this exam. Possibly beyond the collimation. Electronically Signed   By: Elwin Sleight.D.  On: 06/24/2019 20:23    Pending Labs Unresulted Labs (From admission, onward)    Start     Ordered   06/25/19 0500  Comprehensive metabolic panel  Tomorrow morning,   R     06/24/19 2211   06/25/19 0500  CBC  Tomorrow morning,   R     06/24/19 2211   06/24/19 2212  Hepatic function panel  Add-on,   AD     06/24/19 2211   06/24/19 2206  Urinalysis, Routine w reflex microscopic  Once,   STAT     06/24/19 2211   06/24/19 2205  Urine culture  Once,   STAT     06/24/19 2211   06/24/19 2158  Prealbumin  Once,   STAT     06/24/19 2211   06/24/19 2014  SARS CORONAVIRUS 2 (TAT 6-24 HRS) Nasopharyngeal Nasopharyngeal Swab  (Asymptomatic/Tier 2 Patients Labs)  Once,   STAT    Question Answer Comment  Is this test for diagnosis or screening Screening   Symptomatic for COVID-19 as defined by CDC No   Hospitalized for COVID-19 No   Admitted to ICU for COVID-19 No   Previously tested for COVID-19 No   Resident in a congregate (group) care setting No   Employed in healthcare setting No   Pregnant No      06/24/19 2013          Vitals/Pain Today's Vitals   06/24/19 1657 06/24/19 1658 06/24/19 1659  BP: (!) 121/48    Pulse: 91    Resp: 15    Temp: 98.3 F (36.8 C)    TempSrc: Oral    SpO2: 96%    Weight:  48.1 kg   PainSc:   0-No pain    Isolation Precautions No active isolations  Medications Medications  enoxaparin (LOVENOX) injection 40 mg (has no administration in time range)  acetaminophen (TYLENOL) tablet 650 mg (has no administration in time range)    Or  acetaminophen (TYLENOL) suppository 650 mg (has no administration in time range)  traMADol (ULTRAM) tablet 50 mg (has no administration in time range)  docusate sodium (COLACE) capsule  100 mg (has no administration in time range)  ondansetron (ZOFRAN) tablet 4 mg (has no administration in time range)    Or  ondansetron (ZOFRAN) injection 4 mg (has no administration in time range)  cephALEXin (KEFLEX) capsule 500 mg (has no administration in time range)    Mobility walks with person assist Low fall risk   Focused Assessments    R Recommendations: See Admitting Provider Note  Report given to:  Candace RN  Additional Notes:

## 2019-06-24 NOTE — Progress Notes (Signed)
CSW completed TOC assessment with Pts husband, Monica Martinez, who also serves as the caregiver for patient.

## 2019-06-24 NOTE — H&P (Signed)
TRH H&P    Patient Demographics:    Katie Woods, is a 73 y.o. female  MRN: [id number]  DOB - [birth year]  Admit Date - 06/24/2019  Referring MD/NP/PA: Dr. Kathrynn Humble  Outpatient Primary MD for the patient is Christain Sacramento, MD  Patient coming from: Home  Chief complaint-sacral wound   HPI:    Katie Woods  is a 73 y.o. female, with history of trigeminal neuralgia, multiple sclerosis, hypertension, GERD, chronic constipation presents to the ED today for sacral wound.  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  Husband is primary caretaker.  Husband reports that 2 weeks ago he started to notice 1 wound.  He put ointment on it and a pad, and continue to monitor.  Yesterday he noticed that several more wounds were visible.  This morning he noticed that they were malodorous.  During his dressing changes patient reports they are quite painful.  She reports that right now the pain is 7 out of 10.  Pain is intermittent and when he changes the dressing is 9 out of 10.  Wounds have been weeping for the past 2 days.  The drainage is dark brown, and foul-smelling.  Surrounding the wounds is erythematous skin that is indurated.  Husband reports that he started noticing that yesterday.  They both report the patient has had subjective fevers, but no measured fever.  She has not had any shortness of breath, chest pain, abdominal pain, nausea, constipation (due to change of diet with higher fiber intake), or any other new rashes.    Review of systems:    In addition to the HPI above,  Subjective fever and chills No Headache, No changes with Vision or hearing, No problems swallowing food or Liquids, No Chest pain, Cough or Shortness of Breath, No Abdominal pain, No Nausea or Vomiting, bowel movements are regular, No Blood in stool or Urine, Indwelling foley - no dysuria.  Cellulitis and decubitus ulcers present at admission No new  joints pains-aches,  No new weakness, tingling, numbness in any extremity,  No recent weight gain or loss No  polydypsia or polyphagia, No significant Mental Stressors.  All other systems reviewed and are negative.    Past History of the following :    Past Medical History:  Diagnosis Date  . Chronic constipation   . GERD (gastroesophageal reflux disease)   . Hypertension   . Multiple sclerosis ([place]) 1978  . Trigeminal neuralgia       Past Surgical History:  Procedure Laterality Date  . cholescystectomy    . suprapubic catheter    . TONSILLECTOMY    . WISDOM TOOTH EXTRACTION        Social History:      Social History   Tobacco Use  . Smoking status: Never Smoker  . Smokeless tobacco: Never Used  Substance Use Topics  . Alcohol use: No       Family History :     Family History  Problem Relation Age of Onset  . Heart attack Mother   . Diabetes Mother   .  Heart attack Father       Home Medications:   Prior to Admission medications   Medication Sig Start Date End Date Taking? Authorizing Provider  AVONEX PEN 30 MCG/0.5ML AJKT Inject 30 mcg as directed once a week. Every tuesday 04/25/19  Yes [provider]  carbamazepine (CARBATROL) 300 MG 12 hr capsule Take 300 mg by mouth 2 (two) times daily. 251m in the afternoon, 3060mat bedtime.   Yes [provider]  collagenase (SANTYL) ointment Apply 1 application topically daily.   Yes [provider]  ferrous sulfate 325 (65 FE) MG tablet Take 325 mg by mouth 3 (three) times daily with meals.     Yes [provider]  L-Lysine 500 MG TABS Take by mouth.     Yes [provider]  metoprolol tartrate (LOPRESSOR) 50 MG tablet Take 50 mg by mouth daily.   Yes [provider]  pantoprazole (PROTONIX) 40 MG tablet Take 40 mg by mouth. hs   Yes [provider]  Polyethyl Glyc-Propyl Glyc PF (SYSTANE ULTRA PF) 0.4-0.3 % SOLN Apply 1 drop to eye 2 (two) times  daily. Both eyes each morning and evening.   Yes [provider]  pregabalin (LYRICA) 100 MG capsule Take 1 tablet by mouth 2 (two) times daily.   Yes [provider]  sertraline (ZOLOFT) 100 MG tablet Take 100 mg by mouth daily.     Yes [provider]  solifenacin (VESICARE) 10 MG tablet Take 5 mg by mouth daily.     Yes [provider]  Turmeric (QC TUMERIC COMPLEX) 500 MG CAPS Take 1 tablet by mouth 2 (two) times daily.   Yes [provider]  vitamin C (ASCORBIC ACID) 500 MG tablet Take 500 mg by mouth at bedtime.   Yes [provider]  Vitamin D, Cholecalciferol, 25 MCG (1000 UT) TABS Take 5 tablets by mouth at bedtime.   Yes [provider]  vitamin E 600 UNIT capsule Take 600 Units by mouth daily.     Yes [provider]  acetaminophen (TYLENOL) 500 MG tablet Take 500 mg by mouth. 6 TIMES A DAY     [provider]  metoprolol (LOPRESSOR) 50 MG tablet Take 1 tablet (50 mg total) by mouth 2 (two) times daily. Patient not taking: Reported on 06/24/2019 06/13/11 06/12/12  HoMinus BreedingMD     Allergies:     Allergies  Allergen Reactions  . Sulfonamide Derivatives   . Macrodantin [Nitrofurantoin Macrocrystal] Rash     Physical Exam:   Vitals  Blood pressure (!) 121/48, pulse 91, temperature 98.3 F (36.8 C), temperature source Oral, resp. rate 15, weight 48.1 kg, SpO2 96 %.  1.  General: Lying supine in bed in no acute distress  2. Psychiatric: Patient alert and oriented but turns to husband to answer questions about her health  3. Neurologic: Cranial nerves II through X and XII are grossly intact, shoulder shrug is weak  4. HEENMT:  Head is atraumatic normocephalic pupils are reactive to light neck reveals no masses trachea is midline  5. Respiratory : Lungs are clear to auscultation bilaterally  6. Cardiovascular : H RRR no murmurs rubs or gallops  7. Gastrointestinal:  Abdomen is  soft, slightly distended, nontender, normoactive bowel sounds  8. Skin:  Multiple posterior decubitus wounds present on admission.  The largest being at the level of L4, left of midline, approximately 6 cm, with muscle exposed, draining brown malodorous fluid.  There  is surrounding erythema, and induration, that is tender to palpation.  There are stage I and II ulcers also present.  On the right side below the gluteal cleft there is a 2 to 3 cm in diameter circular wound with well-defined edges, with muscle exposed, moist but no active drainage.  9.Musculoskeletal:  Contractures of the feet bilaterally.    Data Review:    CBC Recent Labs  Lab 06/24/19 1815  WBC 7.4  HGB 11.8*  HCT 38.5  PLT 425*  MCV 98.0  MCH 30.0  MCHC 30.6  RDW 13.4  LYMPHSABS 1.4  MONOABS 0.5  EOSABS 0.1  BASOSABS 0.0   ------------------------------------------------------------------------------------------------------------------  Results for orders placed or performed during the hospital encounter of 06/24/19 (from the past 48 hour(s))  Basic metabolic panel     Status: Abnormal   Collection Time: 06/24/19  6:15 PM  Result Value Ref Range   Sodium 136 135 - 145 mmol/L   Potassium 4.2 3.5 - 5.1 mmol/L   Chloride 95 (L) 98 - 111 mmol/L   CO2 30 22 - 32 mmol/L   Glucose, Bld 112 (H) 70 - 99 mg/dL   BUN 22 8 - 23 mg/dL   Creatinine, Ser 0.36 (L) 0.44 - 1.00 mg/dL   Calcium 8.7 (L) 8.9 - 10.3 mg/dL   GFR calc non Af Amer >60 >60 mL/min   GFR calc Af Amer >60 >60 mL/min   Anion gap 11 5 - 15    Comment: Performed at [facility], [address], [place], [place] [address]  CBC with Differential/Platelet     Status: Abnormal   Collection Time: 06/24/19  6:15 PM  Result Value Ref Range   WBC 7.4 4.0 - 10.5 K/uL   RBC 3.93 3.87 - 5.11 MIL/uL   Hemoglobin 11.8 (L) 12.0 - 15.0 g/dL   HCT 38.5 36.0 - 46.0 %   MCV 98.0 80.0 - 100.0 fL   MCH 30.0 26.0 - 34.0 pg   MCHC 30.6 30.0 - 36.0 g/dL   RDW 13.4  11.5 - 15.5 %   Platelets 425 (H) 150 - 400 K/uL   nRBC 0.0 0.0 - 0.2 %   Neutrophils Relative % 71 %   Neutro Abs 5.3 1.7 - 7.7 K/uL   Lymphocytes Relative 20 %   Lymphs Abs 1.4 0.7 - 4.0 K/uL   Monocytes Relative 7 %   Monocytes Absolute 0.5 0.1 - 1.0 K/uL   Eosinophils Relative 1 %   Eosinophils Absolute 0.1 0.0 - 0.5 K/uL   Basophils Relative 0 %   Basophils Absolute 0.0 0.0 - 0.1 K/uL   Immature Granulocytes 1 %   Abs Immature Granulocytes 0.04 0.00 - 0.07 K/uL    Comment: Performed at [facility], [address], [place], [place] [address]  Culture, blood (routine x 2)     Status: None (Preliminary result)   Collection Time: 06/24/19  6:15 PM   Specimen: BLOOD LEFT WRIST  Result Value Ref Range   Specimen Description BLOOD LEFT WRIST    Special Requests      BOTTLES DRAWN AEROBIC AND ANAEROBIC Blood Culture adequate volume Performed at [facility], [address], [place], [place] [address]    Culture PENDING    Report Status PENDING   Sedimentation rate     Status: Abnormal   Collection Time: 06/24/19  6:15 PM  Result Value Ref Range   Sed Rate 60 (H) 0 - 22 mm/hr    Comment: Performed at [facility], [address], [place], [place] [address]  C-reactive protein     Status: Abnormal   Collection Time: 06/24/19  6:15 PM  Result Value Ref Range   CRP 9.0 (H) <1.0 mg/dL    Comment: Performed at [facility], [address], [place], [place] [address]    Chemistries  Recent Labs  Lab 06/24/19 1815  NA 136  K 4.2  CL 95*  CO2 30  GLUCOSE 112*  BUN 22  CREATININE 0.36*  CALCIUM 8.7*   ------------------------------------------------------------------------------------------------------------------  ------------------------------------------------------------------------------------------------------------------ GFR: CrCl cannot be calculated (Unknown ideal weight.). Liver Function Tests: No results for input(s): AST, ALT, ALKPHOS, BILITOT, PROT, ALBUMIN in  the last 168 hours. No results for input(s): LIPASE, AMYLASE in the last 168 hours. No results for input(s): AMMONIA in the last 168 hours. Coagulation Profile: No results for input(s): INR, PROTIME in the last 168 hours. Cardiac Enzymes: No results for input(s): CKTOTAL, CKMB, CKMBINDEX, TROPONINI in the last 168 hours. BNP (last 3 results) No results for input(s): PROBNP in the last 8760 hours. HbA1C: No results for input(s): HGBA1C in the last 72 hours. CBG: No results for input(s): GLUCAP in the last 168 hours. Lipid Profile: No results for input(s): CHOL, HDL, LDLCALC, TRIG, CHOLHDL, LDLDIRECT in the last 72 hours. Thyroid Function Tests: No results for input(s): TSH, T4TOTAL, FREET4, T3FREE, THYROIDAB in the last 72 hours. Anemia Panel: No results for input(s): VITAMINB12, FOLATE, FERRITIN, TIBC, IRON, RETICCTPCT in the last 72 hours.  --------------------------------------------------------------------------------------------------------------- Urine analysis: No results found for: COLORURINE, APPEARANCEUR, LABSPEC, PHURINE, GLUCOSEU, HGBUR, BILIRUBINUR, KETONESUR, PROTEINUR, UROBILINOGEN, NITRITE, LEUKOCYTESUR    Imaging Results:    Dg Pelvis 1-2 Views  Result Date: 06/24/2019 CLINICAL DATA:  Left hip pressure ulcers EXAM: PELVIS - 1-2 VIEW COMPARISON:  Is CT urogram 08/26/2017 FINDINGS: The bones are diffusely demineralized. No radiographically evident ulceration. There is diffuse soft tissue edema of the thighs. No radiopaque marker is identified. No erosion, destructive change or immature periostitis to suggest radiographic features of osteomyelitis. Degenerative changes are noted in the SI joints, both hips and minimally within the symphysis pubis. Bowel gas pattern is unremarkable. IMPRESSION: Diffuse soft tissue edema of the thighs without radiographic evidence of osteomyelitis. Please note a radiopaque marker to indicate the site ulceration is not visualized on this exam.  Possibly beyond the collimation. Electronically Signed   By: Lovena Le M.D.   On: 06/24/2019 20:23       Assessment & Plan:    Active Problems:   Sacral decubitus ulcer, stage IV (HCC)   1. Stage I-IV pressure ulcers on sacrum and surrounding areas present on admission 1. Wounds are nonpurulent 2. Patient's ESR and CRP are only minimally elevated.  No leukocytosis, however there is a thrombocytosis which is also an acute phase reactant. 3. Started on Keflex 4. Consider MRI sacrum, based on clinical progression, to evaluate for osteomyelitis however with out white blood count or markedly elevated inflammatory markers MRI is not indicated at this time. 2. Cellulitis 1. Started on Keflex for nonpurulent mild cellulitis 3. Thrombocytosis 1. Acute phase reactant likely due to cellulitis and sacral wounds 2. As above 4. MS 1. Continue home medication regimen 2. Consult social work and case management to set up placement versus home health 5. GERD 1. Continue PPI 6. Chronic constipation 1. Schedule Dulcolax daily 2. Husband reports that he used to have to use enemas daily for bowel movements, he has not had to do this since recent diet change 3.  Closely monitor for abdominal discomfort, constipation-add enemas as needed? 7. Geminal neuralgia 1. Continue Lyrica and carbamazepine 8. Hypertension 1. Continue home medications 2. Monitor vital signs per floor protocol 9.    DVT Prophylaxis-   Lovenox - SCDs   AM Labs Ordered, also please review Full Orders  Family Communication: Admission, patients condition and plan of care including tests being ordered have been discussed with the patient and husband who indicate understanding and agree with the plan and Code Status.  Code Status: DNR  Admission status: Inpatient: Based on patients clinical presentation and evaluation of above clinical data, I have made determination that patient meets Inpatient criteria at this time.  Time  spent in minutes : 65   Rolla Plate M.D on 06/24/2019 at 10:19 PM

## 2019-06-24 NOTE — Progress Notes (Signed)
Consult request has been received. CSW attempting to follow up at present time  Campbell Kray M. Shalamar Plourde LCSWA Transitions of Care  Clinical Social Worker  Ph: [phone] 

## 2019-06-25 LAB — CBC
HCT: 34.9 % — ABNORMAL LOW (ref 36.0–46.0)
Hemoglobin: 10.8 g/dL — ABNORMAL LOW (ref 12.0–15.0)
MCH: 30.2 pg (ref 26.0–34.0)
MCHC: 30.9 g/dL (ref 30.0–36.0)
MCV: 97.5 fL (ref 80.0–100.0)
Platelets: 378 K/uL (ref 150–400)
RBC: 3.58 MIL/uL — ABNORMAL LOW (ref 3.87–5.11)
RDW: 13.2 % (ref 11.5–15.5)
WBC: 7.3 K/uL (ref 4.0–10.5)
nRBC: 0 % (ref 0.0–0.2)

## 2019-06-25 LAB — COMPREHENSIVE METABOLIC PANEL
ALT: 26 U/L (ref 0–44)
AST: 19 U/L (ref 15–41)
Albumin: 2.2 g/dL — ABNORMAL LOW (ref 3.5–5.0)
Alkaline Phosphatase: 59 U/L (ref 38–126)
Anion gap: 6 (ref 5–15)
BUN: 20 mg/dL (ref 8–23)
CO2: 32 mmol/L (ref 22–32)
Calcium: 8.4 mg/dL — ABNORMAL LOW (ref 8.9–10.3)
Chloride: 98 mmol/L (ref 98–111)
Creatinine, Ser: 0.3 mg/dL — ABNORMAL LOW (ref 0.44–1.00)
GFR calc Af Amer: >60 mL/min (ref >60)
GFR calc non Af Amer: >60 mL/min (ref >60)
Glucose, Bld: 160 mg/dL — ABNORMAL HIGH (ref 70–99)
Potassium: 4.3 mmol/L (ref 3.5–5.1)
Sodium: 136 mmol/L (ref 135–145)
Total Bilirubin: 0.1 mg/dL — ABNORMAL LOW (ref 0.3–1.2)
Total Protein: 5.6 g/dL — ABNORMAL LOW (ref 6.5–8.1)

## 2019-06-25 LAB — URINALYSIS, ROUTINE W REFLEX MICROSCOPIC
Bilirubin Urine: NEGATIVE
Glucose, UA: NEGATIVE mg/dL
Ketones, ur: 5 mg/dL — AB
Nitrite: NEGATIVE
Protein, ur: >=300 mg/dL — AB
RBC / HPF: >50 RBC/hpf — ABNORMAL HIGH (ref 0–5)
Specific Gravity, Urine: 1.02 (ref 1.005–1.030)
WBC, UA: >50 WBC/hpf — ABNORMAL HIGH (ref 0–5)
pH: 7 (ref 5.0–8.0)

## 2019-06-25 LAB — HEPATIC FUNCTION PANEL
ALT: 29 U/L (ref 0–44)
AST: 22 U/L (ref 15–41)
Albumin: 2.5 g/dL — ABNORMAL LOW (ref 3.5–5.0)
Alkaline Phosphatase: 63 U/L (ref 38–126)
Bilirubin, Direct: <0.1 mg/dL (ref 0.0–0.2)
Total Bilirubin: 0.1 mg/dL — ABNORMAL LOW (ref 0.3–1.2)
Total Protein: 6.4 g/dL — ABNORMAL LOW (ref 6.5–8.1)

## 2019-06-25 LAB — SARS CORONAVIRUS 2 (TAT 6-24 HRS): SARS Coronavirus 2: NEGATIVE

## 2019-06-25 LAB — PREALBUMIN: Prealbumin: 13.8 mg/dL — ABNORMAL LOW (ref 18–38)

## 2019-06-25 MED ORDER — SODIUM CHLORIDE 0.9 % IV SOLN
INTRAVENOUS | Status: DC | PRN
  Administered 2019-06-25: 250 mL via INTRAVENOUS

## 2019-06-25 MED ORDER — OCUVITE-LUTEIN PO CAPS
1.0000 | ORAL_CAPSULE | Freq: Every day | ORAL | Status: DC
  Administered 2019-06-25 – 2019-06-28 (×4): 1 via ORAL
  Filled 2019-06-25 (×4): qty 1

## 2019-06-25 MED ORDER — CLINDAMYCIN PHOSPHATE 300 MG/50ML IV SOLN
300.0000 mg | Freq: Three times a day (TID) | INTRAVENOUS | Status: DC
  Administered 2019-06-25 – 2019-06-27 (×6): 300 mg via INTRAVENOUS
  Filled 2019-06-25 (×9): qty 50

## 2019-06-25 MED ORDER — ENSURE ENLIVE PO LIQD
237.0000 mL | Freq: Two times a day (BID) | ORAL | Status: DC
  Administered 2019-06-25 – 2019-06-28 (×5): 237 mL via ORAL

## 2019-06-25 MED ORDER — SENNOSIDES-DOCUSATE SODIUM 8.6-50 MG PO TABS
2.0000 | ORAL_TABLET | Freq: Two times a day (BID) | ORAL | Status: DC
  Administered 2019-06-25 – 2019-06-28 (×6): 2 via ORAL
  Filled 2019-06-25 (×6): qty 2

## 2019-06-25 MED ORDER — CARBAMAZEPINE ER 100 MG PO TB12
200.0000 mg | ORAL_TABLET | Freq: Every day | ORAL | Status: DC
  Administered 2019-06-25 – 2019-06-28 (×4): 200 mg via ORAL
  Filled 2019-06-25 (×6): qty 2

## 2019-06-25 MED ORDER — JUVEN PO PACK
1.0000 | PACK | Freq: Two times a day (BID) | ORAL | Status: DC
  Administered 2019-06-25 – 2019-06-28 (×8): 1 via ORAL
  Filled 2019-06-25 (×7): qty 1

## 2019-06-25 MED ORDER — CLINDAMYCIN PHOSPHATE 300 MG/50ML IV SOLN
300.0000 mg | Freq: Four times a day (QID) | INTRAVENOUS | Status: DC
  Administered 2019-06-25 (×2): 300 mg via INTRAVENOUS
  Filled 2019-06-25 (×9): qty 50

## 2019-06-25 NOTE — Progress Notes (Signed)
Patient has Lyrica ordered BID. Patient's husband wanted me to administer her 2200 dose at this time. I explained to patient's husband that pharmacy has protocols regarding medication administration and I could not administer dose at this time. I explained that I can request her morning dose be scheduled earlier in the morning so she can get medication at 1800. However, he became belligerent and stated he did not want her am dose rescheduled. Patient's husband began shouting at me and stated that he gives her medication twice a day but he does not "follow any pharmacy protocol" MD is currently in room discussing medication with patient's spouse.

## 2019-06-25 NOTE — Progress Notes (Addendum)
Patient Demographics:    Katie Woods, is a 73 y.o. female, DOB - [birth year], ZJQ:[id number]  Admit date - 06/24/2019   Admitting Physician Rolla Plate, DO  Outpatient Primary MD for the patient is Christain Sacramento, MD  LOS - 1   Chief Complaint  Patient presents with  . Wound Infection        Subjective:    Katie Woods today has no fevers, no emesis,  No chest pain,  -patient's husband at bedside yelling, shouting belligerent and abusive to nursing staff -Despite attempts to explain to him plan of care he remains irrate and loud.. RN, and Nurse Tech at bedside during initial visit  -Charge nurse and [facility] also at bedside during second visit--husband remains belligerent and abusive towards nursing staff -Please see nursing documentation   Assessment  & Plan :    Active Problems:   Sacral decubitus ulcer, stage IV (HCC)  Brief Summary- 73 y.o. female, with history of trigeminal neuralgia, multiple sclerosis, hypertension, GERD, chronic constipation presents to the ED today for sacral wound.  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 Arnoldo Morale requested, continue with Santyl ointment for now -IV clindamycin for infected decubitus ulcers as ordered  2)Trigeminal neuralgia--- continue Lyrica 100 mg twice a day and Tegretol.  Okay to use Tylenol and tramadol as needed -IV morphine sulfate prn for severe pain  3)*multiple sclerosis--- patient has underlying neurogenic bladder with suprapubic Foley in situ, as well as neurogenic bowel with chronic constipation--at baseline patient is bedbound and totally dependent on others for ADLs--she has neuromuscular weakness and contractures -Continue routine care for  suprapubic tube Foley catheter, continue bowel regimen for constipation -She gets weekly interferon injections, continue Enablex  4)HTN-stable continue metoprolol 50 mg daily  5) protein caloric malnutrition--- nutritional consult requested, needs Ensure supplements  6)Possible UTI--versus colonization in a patient with chronic suprapubic Foley catheter--no significant UTI symptoms, await culture results   Disposition/Need for in-Hospital Stay- patient unable to be discharged at this time due to infected decubitus ulcers requiring surgical consultation and IV antibiotics  Code Status : DNR  Family Communication:     Discussed with husband at bedside   Disposition Plan  : TBD  Consults  :  Gen surgery  DVT Prophylaxis  :   - SCDs   Lab Results  Component Value Date   PLT 378 06/25/2019    Inpatient Medications  Scheduled Meds: . carbamazepine  200 mg Oral Q1200  . carbamazepine  300 mg Oral QHS  . cholecalciferol  5,000 Units Oral QHS  . collagenase  1 application Topical Daily  . darifenacin  7.5 mg Oral Daily  . docusate sodium  100 mg Oral Daily  . feeding supplement (ENSURE ENLIVE)  237 mL Oral BID BM  . ferrous sulfate  325 mg Oral TID WC  . [START ON 06/28/2019] interferon beta-1a  30 mcg Intramuscular Weekly  . metoprolol tartrate  50 mg Oral Daily  . multivitamin-lutein  1 capsule Oral Daily  . nutrition supplement (JUVEN)  1 packet Oral BID BM  .  pantoprazole  40 mg Oral Daily  . pregabalin  100 mg Oral BID  . sertraline  100 mg Oral Daily  . vitamin C  500 mg Oral QHS  . vitamin E  600 Units Oral Daily   Continuous Infusions: . sodium chloride 250 mL (06/25/19 1423)  . clindamycin (CLEOCIN) IV     PRN Meds:.sodium chloride, acetaminophen **OR** acetaminophen, morphine injection, ondansetron **OR** ondansetron (ZOFRAN) IV, traMADol    Anti-infectives (From admission, onward)   Start     Dose/Rate Route Frequency Ordered Stop   06/25/19 2200   clindamycin (CLEOCIN) IVPB 300 mg     300 mg 100 mL/hr over 30 Minutes Intravenous Every 8 hours 06/25/19 1918     06/25/19 1300  clindamycin (CLEOCIN) IVPB 300 mg  Status:  Discontinued     300 mg 100 mL/hr over 30 Minutes Intravenous Every 6 hours 06/25/19 1221 06/25/19 1918   06/25/19 0000  cephALEXin (KEFLEX) capsule 500 mg  Status:  Discontinued     500 mg Oral Every 6 hours 06/24/19 2214 06/25/19 1918        Objective:   Vitals:   06/25/19 0243 06/25/19 0533 06/25/19 1104 06/25/19 1300  BP:  (!) 112/55 122/62 (!) 99/55  Pulse:  90 79 88  Resp:  18  17  Temp:  97.8 F (36.6 C)  (!) 97 F (36.1 C)  TempSrc:  Oral    SpO2:    93%  Weight:      Height: 5\' 2"  (1.575 m)       Wt Readings from Last 3 Encounters:  06/24/19 48.1 kg  02/12/11 52.2 kg     Intake/Output Summary (Last 24 hours) at 06/25/2019 1954 Last data filed at 06/25/2019 1758 Gross per 24 hour  Intake 676.68 ml  Output -  Net 676.68 ml     Physical Exam  Gen:- Awake Alert, chronically ill-appearing, cachectic HEENT:- Katie Woods, No sclera icterus Neck-Supple Neck,No JVD,.  Lungs-  CTAB , fair symmetrical air movement CV- S1, S2 normal, regular  Abd-  +ve B.Sounds, Abd Soft, No tenderness, suprapubic catheter in situ Extremity - No  edema, pedal pulses present  Psych-affect is appropriate, oriented x3 Neuro-neuromuscular deficits with contractures consistent with advanced multiple sclerosis SKIn--multiple decubitus ulcers please see photos below - Media Information    Document Information  Photos    06/25/2019 12:08  Attached To:  Hospital Encounter on 06/24/19  Source Information  Shon HaleEmokpae, Benjimin Hadden, MD  [facility]   - Media Information    Document Information  Photos    06/25/2019 12:08  Attached To:  Hospital Encounter on 06/24/19  Source Information  Shon HaleEmokpae, Mindy Gali, MD  [facility]    -- Media Information    Document Information  Photos    06/25/2019 12:11   Attached To:  Hospital Encounter on 06/24/19  Source Information  Shon HaleEmokpae, Rubie Ficco, MD  [facility]   -Media Information    Document Information  Photos    06/25/2019 12:09  Attached To:  Hospital Encounter on 06/24/19  Source Information  Shon HaleEmokpae, Brynlee Pennywell, MD  [facility]      Data Review:   Micro Results Recent Results (from the past 240 hour(s))  Culture, blood (routine x 2)     Status: None (Preliminary result)   Collection Time: 06/24/19  6:15 PM   Specimen: BLOOD LEFT WRIST  Result Value Ref Range Status   Specimen Description BLOOD LEFT WRIST  Final   Special Requests   Final  BOTTLES DRAWN AEROBIC AND ANAEROBIC Blood Culture adequate volume   Culture   Final    NO GROWTH < 24 HOURS Performed at [facility], [address], [place], [place] [address]    Report Status PENDING  Incomplete  SARS CORONAVIRUS 2 (TAT 6-24 HRS) Nasopharyngeal Nasopharyngeal Swab     Status: None   Collection Time: 06/24/19  8:40 PM   Specimen: Nasopharyngeal Swab  Result Value Ref Range Status   SARS Coronavirus 2 NEGATIVE NEGATIVE Final    Comment: (NOTE) SARS-CoV-2 target nucleic acids are NOT DETECTED. The SARS-CoV-2 RNA is generally detectable in upper and lower respiratory specimens during the acute phase of infection. Negative results do not preclude SARS-CoV-2 infection, do not rule out co-infections with other pathogens, and should not be used as the sole basis for treatment or other patient management decisions. Negative results must be combined with clinical observations, patient history, and epidemiological information. The expected result is Negative. Fact Sheet for Patients: HairSlick.no Fact Sheet for Healthcare Providers: quierodirigir.com This test is not yet approved or cleared by the [place] FDA and  has been authorized for detection and/or diagnosis of SARS-CoV-2 by FDA under an Emergency Use  Authorization (EUA). This EUA will remain  in effect (meaning this test can be used) for the duration of the COVID-19 declaration under Section 56 4(b)(1) of the Act, 21 U.S.C. section 360bbb-3(b)(1), unless the authorization is terminated or revoked sooner. Performed at [facility], [address], [place], [place] [address]     Radiology Reports Dg Pelvis 1-2 Views  Result Date: 06/24/2019 CLINICAL DATA:  Left hip pressure ulcers EXAM: PELVIS - 1-2 VIEW COMPARISON:  Is CT urogram 08/26/2017 FINDINGS: The bones are diffusely demineralized. No radiographically evident ulceration. There is diffuse soft tissue edema of the thighs. No radiopaque marker is identified. No erosion, destructive change or immature periostitis to suggest radiographic features of osteomyelitis. Degenerative changes are noted in the SI joints, both hips and minimally within the symphysis pubis. Bowel gas pattern is unremarkable. IMPRESSION: Diffuse soft tissue edema of the thighs without radiographic evidence of osteomyelitis. Please note a radiopaque marker to indicate the site ulceration is not visualized on this exam. Possibly beyond the collimation. Electronically Signed   By: Kreg Shropshire M.D.   On: 06/24/2019 20:23     CBC Recent Labs  Lab 06/24/19 1815 06/25/19 0558  WBC 7.4 7.3  HGB 11.8* 10.8*  HCT 38.5 34.9*  PLT 425* 378  MCV 98.0 97.5  MCH 30.0 30.2  MCHC 30.6 30.9  RDW 13.4 13.2  LYMPHSABS 1.4  --   MONOABS 0.5  --   EOSABS 0.1  --   BASOSABS 0.0  --     Chemistries  Recent Labs  Lab 06/24/19 1815 06/25/19 0558  NA 136 136  K 4.2 4.3  CL 95* 98  CO2 30 32  GLUCOSE 112* 160*  BUN 22 20  CREATININE 0.36* 0.30*  CALCIUM 8.7* 8.4*  AST 22 19  ALT 29 26  ALKPHOS 63 59  BILITOT 0.1* 0.1*   ------------------------------------------------------------------------------------------------------------------ No results for input(s): CHOL, HDL, LDLCALC, TRIG, CHOLHDL, LDLDIRECT in  the last 72 hours.  No results found for: HGBA1C ------------------------------------------------------------------------------------------------------------------ No results for input(s): TSH, T4TOTAL, T3FREE, THYROIDAB in the last 72 hours.  Invalid input(s): FREET3 ------------------------------------------------------------------------------------------------------------------ No results for input(s): VITAMINB12, FOLATE, FERRITIN, TIBC, IRON, RETICCTPCT in the last 72 hours.  Coagulation profile No results for input(s): INR, PROTIME in the last 168 hours.  No results for input(s): DDIMER in the last 72 hours.  Cardiac Enzymes No results for input(s): CKMB, TROPONINI, MYOGLOBIN in the last 168 hours.  Invalid input(s): CK ------------------------------------------------------------------------------------------------------------------ No results found for: BNP   Shon Haleourage Armanii Pressnell M.D on 06/25/2019 at 7:54 PM  Go to [email or web address] - for contact info  [facility] - Office  [phone]

## 2019-06-25 NOTE — Progress Notes (Signed)
Pharmacy Renal/BMI Dosing Adjustment  Drug: Lovenox BMI: 19.80m2 Original dose: Lovenox 40mg  daily SCr: 0.66mL/dL  (rounded to 0.8mg /dL for age and TBW<IBW) CrClest~ 46 mL/min New dose: Lovenox 30mg  daily  Thank You, Despina Pole, Pharm. D. Clinical Pharmacist 06/25/2019 1:02 PM

## 2019-06-25 NOTE — Progress Notes (Signed)
Initial Nutrition Assessment  DOCUMENTATION CODES:   Not applicable  INTERVENTION:  -Ensure Enlive po BID, each supplement provides 350 kcal and 20 grams of protein  -Magic cup TID with meals, each supplement provides 290 kcal and 9 grams of protein  -Juven BID, each packet provides 95 calories, 2.5 grams of protein (collagen), and 9.8 grams of carbohydrate (3 grams sugar); also contains 7 grams of L-arginine and L-glutamine, 300 mg vitamin C, 15 mg vitamin E, 1.2 mcg vitamin B-12, 9.5 mg zinc, 200 mg calcium, and 1.5 g  Calcium Beta-hydroxy-Beta-methylbutyrate to support wound healing  -Ocuvite po daily, MVI provides 200 mg vit C, 40 mg zinc, 55 mcg selenium, 2 mg copper, and 2 mg lutein to support wound healing   NUTRITION DIAGNOSIS:   Increased nutrient needs related to wound healing as evidenced by estimated needs.  GOAL:   Patient will meet greater than or equal to 90% of their needs   MONITOR:   PO intake, Weight trends, Supplement acceptance, Skin, I & O's, Labs  REASON FOR ASSESSMENT:   Consult Wound healing  ASSESSMENT:  RD working remotely.  73 year old female with history of trigeminal neurologia, quadriplegic secondary to MS, HTN, GERD, chronic constipation who presents to ED with multiple posterior decubitus wounds; draining brown malodorous fluid.  Patient admitted with stage IV sacral decubitus ulcer; cellulitis; thrombocytosis  Per chart review, pt is cared for by husband who reports noticing 1 wound 2 weeks ago. Husband reports that he put ointment on it and a pad then continued to monitor. Yesterday husband noticed several more wounds were visible; malodorous; dark brown drainage; 9 out of 10 pain this morning per MD note.  Will order Ensure to assist with calorie and protein needs; Juven to support wound healing  Current wt 48.1 kg (105.8 lb) Last wt 52.2 kg taken in 2012 Medications reviewed and include: carbamazepine, keflex, vit D3, colace, ferrous  sulfate, protonix, vit C, vit E  Labs: Ca corrects for low albumin 9.8 Glucose 160  NUTRITION - FOCUSED PHYSICAL EXAM: Unable to complete at this time; RD working remotely.   Diet Order:   Diet Order            Diet Heart Room service appropriate? Yes; Fluid consistency: Thin  Diet effective now              EDUCATION NEEDS:   No education needs have been identified at this time  Skin:  Skin Assessment: Reviewed RN Assessment(stage IV; left; buttocks; Deep PI; rt; buttocks; skin tear; rt buttocks)  Last BM:  9/9  Height:   Ht Readings from Last 1 Encounters:  06/25/19 5\' 2"  (1.575 m)    Weight:   Wt Readings from Last 1 Encounters:  06/24/19 48.1 kg    Ideal Body Weight:  45 kg(Adjusted IBW for quadriplegia)  BMI:  Body mass index is 19.39 kg/m.  Estimated Nutritional Needs:   Kcal:  3220-2542 (30-35 k/kg/IBW)(needs based on IBW 45 kg for quadraplegia)  Protein:  77-81  Fluid:  >1.3L  Lajuan Lines, RD, [place] [phone] After Hours/Weekend Pager: [phone]

## 2019-06-25 NOTE — Progress Notes (Signed)
Patient has Santyl ordered for wound care. Entered room to administer patient's 1000 medications and perform wound care. Husband is irate because I explained that there were no specific orders for wound care. He stated that he was told last night that a wound care nurse would see patient today. I explained that there is no wound care coverage over the weekends and he became belligerent and started shouting at me. Patient's husband asked me if I was going to "debride her wound" I again stated that I am not authorized to perform debridement. However, I will administer Santyl and cover wounds with ABD pads but he was not happy and again began shouting at me. MD was verbally informed and is in room currently assessing patient's wounds and responding to patient's husband's questions.

## 2019-06-26 DIAGNOSIS — G500 Trigeminal neuralgia: Secondary | ICD-10-CM | POA: Diagnosis present

## 2019-06-26 DIAGNOSIS — R627 Adult failure to thrive: Secondary | ICD-10-CM | POA: Diagnosis present

## 2019-06-26 DIAGNOSIS — L89154 Pressure ulcer of sacral region, stage 4: Principal | ICD-10-CM

## 2019-06-26 MED ORDER — VITAMIN E 180 MG (400 UNIT) PO CAPS
400.0000 [IU] | ORAL_CAPSULE | Freq: Every day | ORAL | Status: DC
  Administered 2019-06-26 – 2019-06-28 (×3): 400 [IU] via ORAL
  Filled 2019-06-26 (×2): qty 1

## 2019-06-26 NOTE — Consult Note (Signed)
Reason for Consult: Sacral decubitus ulcer Referring Physician: Dr. Nelva BushEmokpae  Katie Woods is an 73 y.o. female.  HPI: Patient is a 73 year old bedridden white female who presented to [facility] with worsening sacral decubitus ulcer.  Surgery has been asked to evaluate and possibly debride the sacral decubitus ulcer.  Patient is unable to give a history.  The husband provides care for the patient at home.  Past Medical History:  Diagnosis Date  . Chronic constipation   . GERD (gastroesophageal reflux disease)   . Hypertension   . Multiple sclerosis (HCC) 1978  . Paralysis (HCC)   . Trigeminal neuralgia     Past Surgical History:  Procedure Laterality Date  . cholescystectomy    . suprapubic catheter    . TONSILLECTOMY    . WISDOM TOOTH EXTRACTION      Family History  Problem Relation Age of Onset  . Heart attack Mother   . Diabetes Mother   . Heart attack Father     Social History:  reports that she has never smoked. She has never used smokeless tobacco. She reports that she does not drink alcohol or use drugs.  Allergies:  Allergies  Allergen Reactions  . Hydrocodone Nausea Only  . Sulfonamide Derivatives   . Macrodantin [Nitrofurantoin Macrocrystal] Rash    Medications: I have reviewed the patient's current medications.  Results for orders placed or performed during the hospital encounter of 06/24/19 (from the past 48 hour(s))  Basic metabolic panel     Status: Abnormal   Collection Time: 06/24/19  6:15 PM  Result Value Ref Range   Sodium 136 135 - 145 mmol/L   Potassium 4.2 3.5 - 5.1 mmol/L   Chloride 95 (L) 98 - 111 mmol/L   CO2 30 22 - 32 mmol/L   Glucose, Bld 112 (H) 70 - 99 mg/dL   BUN 22 8 - 23 mg/dL   Creatinine, Ser 1.610.36 (L) 0.44 - 1.00 mg/dL   Calcium 8.7 (L) 8.9 - 10.3 mg/dL   GFR calc non Af Amer >60 >60 mL/min   GFR calc Af Amer >60 >60 mL/min   Anion gap 11 5 - 15    Comment: Performed at [facility], [address] [place],  [place] [address]  CBC with Differential/Platelet     Status: Abnormal   Collection Time: 06/24/19  6:15 PM  Result Value Ref Range   WBC 7.4 4.0 - 10.5 K/uL   RBC 3.93 3.87 - 5.11 MIL/uL   Hemoglobin 11.8 (L) 12.0 - 15.0 g/dL   HCT 54.038.5 98.136.0 - 19.146.0 %   MCV 98.0 80.0 - 100.0 fL   MCH 30.0 26.0 - 34.0 pg   MCHC 30.6 30.0 - 36.0 g/dL   RDW 47.813.4 29.511.5 - 62.115.5 %   Platelets 425 (H) 150 - 400 K/uL   nRBC 0.0 0.0 - 0.2 %   Neutrophils Relative % 71 %   Neutro Abs 5.3 1.7 - 7.7 K/uL   Lymphocytes Relative 20 %   Lymphs Abs 1.4 0.7 - 4.0 K/uL   Monocytes Relative 7 %   Monocytes Absolute 0.5 0.1 - 1.0 K/uL   Eosinophils Relative 1 %   Eosinophils Absolute 0.1 0.0 - 0.5 K/uL   Basophils Relative 0 %   Basophils Absolute 0.0 0.0 - 0.1 K/uL   Immature Granulocytes 1 %   Abs Immature Granulocytes 0.04 0.00 - 0.07 K/uL    Comment: Performed at [facility], [address] [place], [place] [address]  Culture, blood (routine x 2)     Status: None (Preliminary result)   Collection Time: 06/24/19  6:15 PM   Specimen: BLOOD LEFT WRIST  Result Value Ref Range   Specimen Description BLOOD LEFT WRIST    Special Requests      BOTTLES DRAWN AEROBIC AND ANAEROBIC Blood Culture adequate volume   Culture      NO GROWTH < 24 HOURS Performed at [facility], [address], [place], [place] [address]    Report Status PENDING   Sedimentation rate     Status: Abnormal   Collection Time: 06/24/19  6:15 PM  Result Value Ref Range   Sed Rate 60 (H) 0 - 22 mm/hr    Comment: Performed at [facility], [address] [place], [place] [address]  C-reactive protein     Status: Abnormal   Collection Time: 06/24/19  6:15 PM  Result Value Ref Range   CRP 9.0 (H) <1.0 mg/dL    Comment: Performed at [facility], [address], [place], [place] [address]  Prealbumin     Status: Abnormal   Collection Time: 06/24/19  6:15 PM  Result Value Ref Range   Prealbumin 13.8 (L) 18 - 38 mg/dL    Comment: Performed at [facility], [place] [address] [place], [place] [address]  Hepatic function panel     Status: Abnormal   Collection Time: 06/24/19  6:15 PM  Result Value Ref Range   Total Protein 6.4 (L) 6.5 - 8.1 g/dL   Albumin 2.5 (L) 3.5 - 5.0 g/dL   AST 22 15 - 41 U/L   ALT 29 0 - 44 U/L   Alkaline Phosphatase 63 38 - 126 U/L   Total Bilirubin 0.1 (L) 0.3 - 1.2 mg/dL   Bilirubin, Direct <0.1 0.0 - 0.2 mg/dL   Indirect Bilirubin NOT CALCULATED 0.3 - 0.9 mg/dL    Comment: Performed at [facility], [address], [place], [place] [address]  SARS CORONAVIRUS 2 (TAT 6-24 HRS) Nasopharyngeal Nasopharyngeal Swab     Status: None   Collection Time: 06/24/19  8:40 PM   Specimen: Nasopharyngeal Swab  Result Value Ref Range   SARS Coronavirus 2 NEGATIVE NEGATIVE    Comment: (NOTE) SARS-CoV-2 target nucleic acids are NOT DETECTED. The SARS-CoV-2 RNA is generally detectable in upper and lower respiratory specimens during the acute phase of infection. Negative results do not preclude SARS-CoV-2 infection, do not rule out co-infections with other pathogens, and should not be used as the sole basis for treatment or other patient management decisions. Negative results must be combined with clinical observations, patient history, and epidemiological information. The expected result is Negative. Fact Sheet for Patients: SugarRoll.be Fact Sheet for Healthcare Providers: https://www.woods-mathews.com/ This test is not yet approved or cleared by the [place] FDA and  has been authorized for detection and/or diagnosis of SARS-CoV-2 by FDA under an Emergency Use Authorization (EUA). This EUA will remain  in effect (meaning this test can be used) for the duration of the COVID-19 declaration under Section 56 4(b)(1) of the Act, 21 U.S.C. section 360bbb-3(b)(1), unless the authorization is terminated or revoked sooner. Performed at [facility], [place] [address],  [place], [place] [address]   Comprehensive metabolic panel     Status: Abnormal   Collection Time: 06/25/19  5:58 AM  Result Value Ref Range   Sodium 136 135 - 145 mmol/L   Potassium 4.3 3.5 - 5.1 mmol/L   Chloride 98 98 -  111 mmol/L   CO2 32 22 - 32 mmol/L   Glucose, Bld 160 (H) 70 - 99 mg/dL   BUN 20 8 - 23 mg/dL   Creatinine, Ser 8.110.30 (L) 0.44 - 1.00 mg/dL   Calcium 8.4 (L) 8.9 - 10.3 mg/dL   Total Protein 5.6 (L) 6.5 - 8.1 g/dL   Albumin 2.2 (L) 3.5 - 5.0 g/dL   AST 19 15 - 41 U/L   ALT 26 0 - 44 U/L   Alkaline Phosphatase 59 38 - 126 U/L   Total Bilirubin 0.1 (L) 0.3 - 1.2 mg/dL   GFR calc non Af Amer >60 >60 mL/min   GFR calc Af Amer >60 >60 mL/min   Anion gap 6 5 - 15    Comment: Performed at [facility], [address] [place], [place] [address]  CBC     Status: Abnormal   Collection Time: 06/25/19  5:58 AM  Result Value Ref Range   WBC 7.3 4.0 - 10.5 K/uL   RBC 3.58 (L) 3.87 - 5.11 MIL/uL   Hemoglobin 10.8 (L) 12.0 - 15.0 g/dL   HCT 29.534.9 (L) 62.136.0 - 30.846.0 %   MCV 97.5 80.0 - 100.0 fL   MCH 30.2 26.0 - 34.0 pg   MCHC 30.9 30.0 - 36.0 g/dL   RDW 65.713.2 84.611.5 - 96.215.5 %   Platelets 378 150 - 400 K/uL   nRBC 0.0 0.0 - 0.2 %    Comment: Performed at [facility], [address] [place], [place] [address]  Urinalysis, Routine w reflex microscopic     Status: Abnormal   Collection Time: 06/25/19 10:58 AM  Result Value Ref Range   Color, Urine AMBER (A) YELLOW    Comment: BIOCHEMICALS MAY BE AFFECTED BY COLOR   APPearance TURBID (A) CLEAR   Specific Gravity, Urine 1.020 1.005 - 1.030   pH 7.0 5.0 - 8.0   Glucose, UA NEGATIVE NEGATIVE mg/dL   Hgb urine dipstick MODERATE (A) NEGATIVE   Bilirubin Urine NEGATIVE NEGATIVE   Ketones, ur 5 (A) NEGATIVE mg/dL   Protein, ur >=132>=300 (A) NEGATIVE mg/dL   Nitrite NEGATIVE NEGATIVE   Leukocytes,Ua MODERATE (A) NEGATIVE   RBC / HPF >50 (H) 0 - 5 RBC/hpf   WBC, UA >50 (H) 0 - 5 WBC/hpf   Bacteria, UA RARE (A) NONE SEEN   Squamous  Epithelial / LPF 11-20 0 - 5   WBC Clumps PRESENT    Mucus PRESENT    Amorphous Crystal PRESENT    Ca Oxalate Crys, UA PRESENT     Comment: Performed at [facility], [address] [place], [place] [address]    Dg Pelvis 1-2 Views  Result Date: 06/24/2019 CLINICAL DATA:  Left hip pressure ulcers EXAM: PELVIS - 1-2 VIEW COMPARISON:  Is CT urogram 08/26/2017 FINDINGS: The bones are diffusely demineralized. No radiographically evident ulceration. There is diffuse soft tissue edema of the thighs. No radiopaque marker is identified. No erosion, destructive change or immature periostitis to suggest radiographic features of osteomyelitis. Degenerative changes are noted in the SI joints, both hips and minimally within the symphysis pubis. Bowel gas pattern is unremarkable. IMPRESSION: Diffuse soft tissue edema of the thighs without radiographic evidence of osteomyelitis. Please note a radiopaque marker to indicate the site ulceration is not visualized on this exam. Possibly beyond the collimation. Electronically Signed   By: Kreg ShropshirePrice  DeHay M.D.   On: 06/24/2019 20:23    ROS:  Review of systems not obtained due to patient  factors.  Blood pressure (!) 120/48, pulse 78, temperature 98 F (36.7 C), temperature source Oral, resp. rate 13, height 5\' 2"  (1.575 m), weight 48.1 kg, SpO2 98 %. Physical Exam: Nonverbal white female no acute distress.  Some upper extremity contraction noted. Skin: Multiple superficial skin ulcerations were present.  In the upper right sacral region, a large eschar was present, approximately 4 to 5 cm in its greatest diameter.  After obtaining permission from the husband, I sharply debrided the thick eschar with a scalpel.  The resultant wound is a stage IV decubitus ulcer to the bone.  All necrotic areas were removed.  A smaller 2 to 3 cm open wound down to the subcutaneous tissue was noted in the lower right buttock.  Multiple pictures were taken.  Patient tolerated the procedure  well.  Assessment/Plan: Impression: Stage IV sacral decubitus ulcer, multiple skin breakdown and buttock regions secondary to bedridden state, deconditioning, paralysis, multiple sclerosis Plan: Normal saline wet-to-dry dressings to the deep sacral and buttock ulcerations.  Continue frequent repositioning.  Dr. Mariea Clonts and nursing staff present during dressing change.  Franky Macho 06/26/2019, 10:13 AM

## 2019-06-26 NOTE — Plan of Care (Signed)

## 2019-06-26 NOTE — Progress Notes (Addendum)
Patient Demographics:    Katie Woods, is a 73 y.o. female, DOB - [birth year], PTW:[id number]  Admit date - 06/24/2019   Admitting Physician Lilyan Gilford, DO  Outpatient Primary MD for the patient is Barbie Banner, MD  LOS - 2   Chief Complaint  Patient presents with  . Wound Infection        Subjective:    Katie Woods today has no fevers, no emesis,  No chest pain,  - - General surgeon Dr. Lovell Sheehan at bedside, RN and notes tachycardia at bedside -Husband at bedside, questions answered -No fevers, no significant new events overnight  Assessment  & Plan :    Principal Problem:   Sacral decubitus ulcer, stage IV --- presumed infected Active Problems:   MULTIPLE SCLEROSIS, PROGRESSIVE/RELAPSING   HTN (hypertension)   Trigeminal neuralgia   FTT (failure to thrive) in adult/Protein Caloric Malnutrition  Brief Summary- 73 y.o.female,with history of trigeminal neuralgia, multiple sclerosis, hypertension, GERD, chronic constipation admitted on 06/24/2019 with multiple decubitus ulcers and sacral wounds. 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  Okay to use Tylenol and tramadol as needed -IV morphine sulfate prn for severe pain  3)Multiple sclerosis--- patient has underlying neurogenic bladder with suprapubic Foley in situ, as well as neurogenic bowel with chronic  constipation--at baseline patient is bedbound and wheelchair bound and totally dependent on others for ADLs--she has neuromuscular weakness and contractures -Continue routine care for suprapubic tube Foley catheter, continue bowel regimen for constipation -She gets weekly interferon injections, continue Enablex  4)HTN-stable continue metoprolol 50 mg daily  5) protein caloric malnutrition--- nutritional consult requested, needs Ensure supplements  6)Possible UTI--versus colonization in a patient with chronic suprapubic Foley catheter--no significant UTI symptoms, await culture results  Disposition/Need for in-Hospital Stay- patient unable to be discharged at this time due to infected decubitus ulcers requiring surgical consultation and IV antibiotics  Code Status : DNR  Family Communication:     Discussed with husband at bedside   Disposition Plan  : --Patient and husband declined SNF rehab, plan to return home  Consults  :  Gen surgery  DVT Prophylaxis  :   - SCDs   Code Status : DNR  Lab Results  Component Value Date   PLT 378 06/25/2019    Inpatient Medications  Scheduled Meds: . carbamazepine  200 mg Oral Q1200  . carbamazepine  300 mg Oral QHS  . cholecalciferol  5,000 Units Oral QHS  . collagenase  1 application Topical Daily  . darifenacin  7.5 mg Oral Daily  . feeding supplement (ENSURE ENLIVE)  237 mL Oral BID BM  . ferrous sulfate  325 mg Oral TID WC  . [START ON 06/28/2019] interferon beta-1a  30 mcg Intramuscular Weekly  . metoprolol tartrate  50  mg Oral Daily  . multivitamin-lutein  1 capsule Oral Daily  . nutrition supplement (JUVEN)  1 packet Oral BID BM  . pantoprazole  40 mg Oral Daily  . pregabalin  100 mg Oral BID  . senna-docusate  2 tablet Oral BID  . sertraline  100 mg Oral Daily  . vitamin C  500 mg Oral QHS  . vitamin E  400 Units Oral Daily   Continuous Infusions: . sodium chloride 10 mL/hr at 06/26/19 0800  . clindamycin (CLEOCIN)  IV Stopped (06/26/19 0705)   PRN Meds:.sodium chloride, acetaminophen **OR** acetaminophen, morphine injection, ondansetron **OR** ondansetron (ZOFRAN) IV, traMADol    Anti-infectives (From admission, onward)   Start     Dose/Rate Route Frequency Ordered Stop   06/25/19 2200  clindamycin (CLEOCIN) IVPB 300 mg     300 mg 100 mL/hr over 30 Minutes Intravenous Every 8 hours 06/25/19 1918     06/25/19 1300  clindamycin (CLEOCIN) IVPB 300 mg  Status:  Discontinued     300 mg 100 mL/hr over 30 Minutes Intravenous Every 6 hours 06/25/19 1221 06/25/19 1918   06/25/19 0000  cephALEXin (KEFLEX) capsule 500 mg  Status:  Discontinued     500 mg Oral Every 6 hours 06/24/19 2214 06/25/19 1918        Objective:   Vitals:   06/25/19 1300 06/25/19 2100 06/25/19 2102 06/26/19 0513  BP: (!) 99/55 (!) 113/51 (!) 113/51 (!) 120/48  Pulse: 88 75 84 78  Resp: 17 20 20 13   Temp: (!) 97 F (36.1 C) 98.1 F (36.7 C) 98.1 F (36.7 C) 98 F (36.7 C)  TempSrc:    Oral  SpO2: 93%  94% 98%  Weight:      Height:        Wt Readings from Last 3 Encounters:  06/24/19 48.1 kg  02/12/11 52.2 kg     Intake/Output Summary (Last 24 hours) at 06/26/2019 1040 Last data filed at 06/26/2019 0800 Gross per 24 hour  Intake 650.42 ml  Output 550 ml  Net 100.42 ml     Physical Exam  Gen:- Awake Alert, chronically ill-appearing, cachectic HEENT:- [place].AT, No sclera icterus Neck-Supple Neck,No JVD,.  Lungs-  CTAB , fair symmetrical air movement CV- S1, S2 normal, regular  Abd-  +ve B.Sounds, Abd Soft, No tenderness, suprapubic catheter in situ Extremity - No  edema, pedal pulses present  Psych-affect is appropriate, oriented x3 Neuro-neuromuscular deficits with contractures consistent with advanced multiple sclerosis SKIn--multiple decubitus ulcers please see photos below - Media Information    Document Information  Photos    06/26/2019 09:55  Attached To:  Hospital Encounter on 06/24/19  Source  Information  Shon HaleEmokpae, Arnitra Sokoloski, MD  [facility]    -- Media Information    Document Information  Photos    06/26/2019 09:56  Attached To:  Hospital Encounter on 06/24/19  Source Information  Shon HaleEmokpae, Margarett Viti, MD  [facility]   --  Media Information    Document Information  Photos    06/26/2019 09:57  Attached To:  Hospital Encounter on 06/24/19  Source Information  Shon HaleEmokpae, Darcy Cordner, MD  [facility]      Data Review:   Micro Results Recent Results (from the past 240 hour(s))  Culture, blood (routine x 2)     Status: None (Preliminary result)   Collection Time: 06/24/19  6:15 PM   Specimen: BLOOD LEFT WRIST  Result Value Ref Range Status   Specimen Description BLOOD LEFT WRIST  Final  Special Requests   Final    BOTTLES DRAWN AEROBIC AND ANAEROBIC Blood Culture adequate volume   Culture   Final    NO GROWTH < 24 HOURS Performed at [facility], [address] [place], [place] [address]    Report Status PENDING  Incomplete  SARS CORONAVIRUS 2 (TAT 6-24 HRS) Nasopharyngeal Nasopharyngeal Swab     Status: None   Collection Time: 06/24/19  8:40 PM   Specimen: Nasopharyngeal Swab  Result Value Ref Range Status   SARS Coronavirus 2 NEGATIVE NEGATIVE Final    Comment: (NOTE) SARS-CoV-2 target nucleic acids are NOT DETECTED. The SARS-CoV-2 RNA is generally detectable in upper and lower respiratory specimens during the acute phase of infection. Negative results do not preclude SARS-CoV-2 infection, do not rule out co-infections with other pathogens, and should not be used as the sole basis for treatment or other patient management decisions. Negative results must be combined with clinical observations, patient history, and epidemiological information. The expected result is Negative. Fact Sheet for Patients: HairSlick.nohttps://www.fda.gov/media/138098/download Fact Sheet for Healthcare Providers: quierodirigir.comhttps://www.fda.gov/media/138095/download This test is not yet  approved or cleared by the [place] FDA and  has been authorized for detection and/or diagnosis of SARS-CoV-2 by FDA under an Emergency Use Authorization (EUA). This EUA will remain  in effect (meaning this test can be used) for the duration of the COVID-19 declaration under Section 56 4(b)(1) of the Act, 21 U.S.C. section 360bbb-3(b)(1), unless the authorization is terminated or revoked sooner. Performed at [facility], [address] [place], [place] [address]     Radiology Reports Dg Pelvis 1-2 Views  Result Date: 06/24/2019 CLINICAL DATA:  Left hip pressure ulcers EXAM: PELVIS - 1-2 VIEW COMPARISON:  Is CT urogram 08/26/2017 FINDINGS: The bones are diffusely demineralized. No radiographically evident ulceration. There is diffuse soft tissue edema of the thighs. No radiopaque marker is identified. No erosion, destructive change or immature periostitis to suggest radiographic features of osteomyelitis. Degenerative changes are noted in the SI joints, both hips and minimally within the symphysis pubis. Bowel gas pattern is unremarkable. IMPRESSION: Diffuse soft tissue edema of the thighs without radiographic evidence of osteomyelitis. Please note a radiopaque marker to indicate the site ulceration is not visualized on this exam. Possibly beyond the collimation. Electronically Signed   By: Kreg ShropshirePrice  DeHay M.D.   On: 06/24/2019 20:23     CBC Recent Labs  Lab 06/24/19 1815 06/25/19 0558  WBC 7.4 7.3  HGB 11.8* 10.8*  HCT 38.5 34.9*  PLT 425* 378  MCV 98.0 97.5  MCH 30.0 30.2  MCHC 30.6 30.9  RDW 13.4 13.2  LYMPHSABS 1.4  --   MONOABS 0.5  --   EOSABS 0.1  --   BASOSABS 0.0  --     Chemistries  Recent Labs  Lab 06/24/19 1815 06/25/19 0558  NA 136 136  K 4.2 4.3  CL 95* 98  CO2 30 32  GLUCOSE 112* 160*  BUN 22 20  CREATININE 0.36* 0.30*  CALCIUM 8.7* 8.4*  AST 22 19  ALT 29 26  ALKPHOS 63 59  BILITOT 0.1* 0.1*    ------------------------------------------------------------------------------------------------------------------ No results for input(s): CHOL, HDL, LDLCALC, TRIG, CHOLHDL, LDLDIRECT in the last 72 hours.  No results found for: HGBA1C ------------------------------------------------------------------------------------------------------------------ No results for input(s): TSH, T4TOTAL, T3FREE, THYROIDAB in the last 72 hours.  Invalid input(s): FREET3 ------------------------------------------------------------------------------------------------------------------ No results for input(s): VITAMINB12, FOLATE, FERRITIN, TIBC, IRON, RETICCTPCT in the last 72 hours.  Coagulation profile No results for  input(s): INR, PROTIME in the last 168 hours.  No results for input(s): DDIMER in the last 72 hours.  Cardiac Enzymes No results for input(s): CKMB, TROPONINI, MYOGLOBIN in the last 168 hours.  Invalid input(s): CK ------------------------------------------------------------------------------------------------------------------ No results found for: BNP   Roxan Hockey M.D on 06/26/2019 at 10:40 AM  Go to [email or web address] - for contact info  [facility] - Office  [phone]

## 2019-06-27 MED ORDER — BISACODYL 10 MG RE SUPP
10.0000 mg | Freq: Once | RECTAL | Status: AC
  Administered 2019-06-27: 10:00:00 10 mg via RECTAL
  Filled 2019-06-27: qty 1

## 2019-06-27 MED ORDER — SODIUM CHLORIDE 0.9 % IV SOLN
1.0000 g | INTRAVENOUS | Status: DC
  Administered 2019-06-27: 1 g via INTRAVENOUS
  Filled 2019-06-27: qty 10

## 2019-06-27 MED ORDER — LACTULOSE 10 GM/15ML PO SOLN
30.0000 g | Freq: Every day | ORAL | Status: DC
  Administered 2019-06-28: 11:00:00 via ORAL
  Filled 2019-06-27: qty 60

## 2019-06-27 MED ORDER — CEPHALEXIN 500 MG PO CAPS: 500.0000 mg | ORAL_CAPSULE | Freq: Three times a day (TID) | ORAL | Status: DC

## 2019-06-27 MED ORDER — CEPHALEXIN 500 MG PO CAPS
500.0000 mg | ORAL_CAPSULE | Freq: Two times a day (BID) | ORAL | Status: DC
  Administered 2019-06-27: 500 mg via ORAL
  Filled 2019-06-27: qty 1

## 2019-06-27 NOTE — Progress Notes (Signed)
Nutrition Follow-up  DOCUMENTATION CODES:   Not applicable  INTERVENTION:  Continue:  -Ensure BID  -Juven BID  -Ocuvite daily  NUTRITION DIAGNOSIS:   Increased nutrient needs related to wound healing as evidenced by estimated needs.  Ongoing  GOAL:   Patient will meet greater than or equal to 90% of their needs  Progressing; patient eating 50% of meals 9/12: Ensure and Magic Cup ordered   MONITOR:   PO intake, Weight trends, Supplement acceptance, Skin, I & O's, Labs  REASON FOR ASSESSMENT:   Consult Wound healing  ASSESSMENT:   73 year old female with history of trigeminal neurologia, quadriplegic secondary to MS, HTN, GERD, chronic constipation who presents to ED with multiple posterior decubitus wounds; draining brown malodorous fluid.  Patient admitted with stage IV sacral decubitus ulcer; cellulitis; thrombocytosis  9/14: Last BM x 6 days; Soap suds enema given today; will continue to monitor   Per chart review; patient will be going home with Home Health, wound clinic appointment in [place], low pressure air mattress.   Diet Order:   Diet Order            Diet Heart Room service appropriate? Yes; Fluid consistency: Thin  Diet effective now              EDUCATION NEEDS:   No education needs have been identified at this time  Skin:  Skin Assessment: Reviewed RN Assessment(stage IV; left; buttocks; Deep PI; rt; buttocks; skin tear; rt buttocks)  Last BM:  9/9  Height:   Ht Readings from Last 1 Encounters:  06/25/19 5\' 2"  (1.575 m)    Weight:   Wt Readings from Last 1 Encounters:  06/24/19 48.1 kg    Ideal Body Weight:  45 kg(Adjusted IBW for quadriplegia)  BMI:  Body mass index is 19.39 kg/m.  Estimated Nutritional Needs:   Kcal:  4081-4481 (30-35 k/kg/IBW)(needs based on IBW 45 kg for quadraplegia)  Protein:  77-81  Fluid:  >1.3L   Lajuan Lines, RD, LDN Office [phone] After Hours/Weekend Pager: [phone]

## 2019-06-27 NOTE — Progress Notes (Signed)
Patient Demographics:    Katie Woods, is a 73 y.o. female, DOB - 12/07/1945, WGN:[id number]  Admit date - 06/24/2019   Admitting Physician Lilyan GilfordAsia B Zierle-Ghosh, DO  Outpatient Primary MD for the patient is Barbie BannerWilson, Fred H, MD  LOS - 3   Chief Complaint  Patient presents with  . Wound Infection        Subjective:    Katie Ralphsondra Hegna today has no fevers, no emesis,  No chest pain,  - - Urine culture with Morganella morganii--unable to discharge on oral agent pending sensitivity report, -Patient required manual disimpaction by RN after failed Dulcolax suppository and soapsuds enema  Assessment  & Plan :    Principal Problem:   Sacral decubitus ulcer, stage IV --- presumed infected Active Problems:   MULTIPLE SCLEROSIS, PROGRESSIVE/RELAPSING   HTN (hypertension)   Trigeminal neuralgia   FTT (failure to thrive) in adult/Protein Caloric Malnutrition  Brief Summary- 73 y.o.female,with history of trigeminal neuralgia, multiple sclerosis, hypertension, GERD, chronic constipation admitted on 06/24/2019 with multiple decubitus ulcers and sacral wounds. 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  Okay to use Tylenol and tramadol as needed -IV morphine sulfate prn for severe pain  3)Multiple sclerosis--- patient has underlying neurogenic bladder with  suprapubic Foley in situ, as well as neurogenic bowel with chronic constipation--at baseline patient is bedbound and wheelchair bound and totally dependent on others for ADLs--she has neuromuscular weakness and contractures -Continue routine care for suprapubic tube Foley catheter, continue bowel regimen for constipation -She gets weekly interferon injections, continue Enablex  4)HTN-stable continue metoprolol 50 mg daily  5)Protein caloric malnutrition--- nutritional consult appreciated, needs  supplements  6) Morganella morganii UTI--versus colonization in a patient with chronic suprapubic Foley catheter--treat empirically with IV Rocephin pending sensitivity report  7)Constipation-Patient required manual disimpaction by RN after failed Dulcolax suppository and soapsuds enema  Disposition/Need for in-Hospital Stay- patient unable to be discharged at this time due to infected decubitus ulcers and Morganella morganii UTI requiring surgical consultation and IV antibiotics pending sensitivity report  Code Status : DNR  Family Communication:     Discussed with husband at bedside   Disposition Plan  : --Patient and husband declined SNF rehab, [facility]  Consults  :  Gen surgery  DVT Prophylaxis  :   - SCDs   Code Status : DNR  Lab Results  Component Value Date   PLT 378 06/25/2019    Inpatient Medications  Scheduled Meds: . carbamazepine  200 mg Oral Q1200  . carbamazepine  300 mg Oral QHS  . cholecalciferol  5,000 Units Oral QHS  . collagenase  1 application Topical Daily  . darifenacin  7.5 mg Oral Daily  . feeding supplement (ENSURE ENLIVE)  237  mL Oral BID BM  . ferrous sulfate  325 mg Oral TID WC  . metoprolol tartrate  50 mg Oral Daily  . multivitamin-lutein  1 capsule Oral Daily  . nutrition supplement (JUVEN)  1 packet Oral BID BM  . pantoprazole  40 mg Oral Daily  . pregabalin  100 mg Oral BID  . senna-docusate  2 tablet Oral BID  .  sertraline  100 mg Oral Daily  . vitamin C  500 mg Oral QHS  . vitamin E  400 Units Oral Daily   Continuous Infusions: . sodium chloride 10 mL/hr at 06/26/19 0800  . cefTRIAXone (ROCEPHIN)  IV 1 g (06/27/19 1756)   PRN Meds:.sodium chloride, acetaminophen **OR** acetaminophen, morphine injection, ondansetron **OR** ondansetron (ZOFRAN) IV, traMADol   Anti-infectives (From admission, onward)   Start     Dose/Rate Route Frequency Ordered Stop   06/27/19 1800  cefTRIAXone (ROCEPHIN) 1 g in sodium chloride 0.9 % 100 mL IVPB     1 g 200 mL/hr over 30 Minutes Intravenous Every 24 hours 06/27/19 1711     06/27/19 1000  cephALEXin (KEFLEX) capsule 500 mg  Status:  Discontinued     500 mg Oral Every 12 hours 06/27/19 0802 06/27/19 1710   06/27/19 0815  cephALEXin (KEFLEX) capsule 500 mg  Status:  Discontinued     500 mg Oral Every 8 hours 06/27/19 0801 06/27/19 0802   06/25/19 2200  clindamycin (CLEOCIN) IVPB 300 mg  Status:  Discontinued     300 mg 100 mL/hr over 30 Minutes Intravenous Every 8 hours 06/25/19 1918 06/27/19 1710   06/25/19 1300  clindamycin (CLEOCIN) IVPB 300 mg  Status:  Discontinued     300 mg 100 mL/hr over 30 Minutes Intravenous Every 6 hours 06/25/19 1221 06/25/19 1918   06/25/19 0000  cephALEXin (KEFLEX) capsule 500 mg  Status:  Discontinued     500 mg Oral Every 6 hours 06/24/19 2214 06/25/19 1918        Objective:   Vitals:   06/26/19 2123 06/27/19 0516 06/27/19 0804 06/27/19 1337  BP: (!) 116/49 (!) 135/51  (!) 112/49  Pulse: 73 88  71  Resp:    20  Temp: 97.8 F (36.6 C) 98.1 F (36.7 C)  97.7 F (36.5 C)  TempSrc: Oral Oral    SpO2: 97% 94% 95% 98%  Weight:      Height:        Wt Readings from Last 3 Encounters:  06/24/19 48.1 kg  02/12/11 52.2 kg    Intake/Output Summary (Last 24 hours) at 06/27/2019 1822 Last data filed at 06/27/2019 [id number] Gross per 24 hour  Intake 50.43 ml  Output 1000 ml  Net -949.57 ml   Physical Exam  Gen:- Awake  Alert, chronically ill-appearing, cachectic HEENT:- [place].AT, No sclera icterus Neck-Supple Neck,No JVD,.  Lungs-  CTAB , fair symmetrical air movement CV- S1, S2 normal, regular  Abd-  +ve B.Sounds, Abd Soft, No tenderness, suprapubic catheter in situ Extremity - No  edema, pedal pulses present  Psych-affect is appropriate, oriented x3 Neuro-neuromuscular deficits with contractures consistent with advanced multiple sclerosis SKIn--multiple decubitus ulcers please see photos below - Media Information    Document Information  Photos    06/26/2019 09:55  Attached To:  Hospital Encounter on 06/24/19  Source Information  Shon HaleEmokpae, Coralee Edberg, MD  [facility]    -- Media Information    Document Information  Photos    06/26/2019 09:56  Attached To:  Hospital Encounter  on 06/24/19  Source Information  Roxan Hockey, MD  [facility]   --  Media Information    Document Information  Photos    06/26/2019 09:57  Attached To:  Hospital Encounter on 06/24/19  Source Information  Roxan Hockey, MD  [facility]      Data Review:   Micro Results Recent Results (from the past 240 hour(s))  Culture, blood (routine x 2)     Status: None (Preliminary result)   Collection Time: 06/24/19  6:15 PM   Specimen: BLOOD LEFT WRIST  Result Value Ref Range Status   Specimen Description BLOOD LEFT WRIST  Final   Special Requests   Final    BOTTLES DRAWN AEROBIC AND ANAEROBIC Blood Culture adequate volume   Culture   Final    NO GROWTH 3 DAYS Performed at [facility], [address] [place], [place] [address]    Report Status PENDING  Incomplete  SARS CORONAVIRUS 2 (TAT 6-24 HRS) Nasopharyngeal Nasopharyngeal Swab     Status: None   Collection Time: 06/24/19  8:40 PM   Specimen: Nasopharyngeal Swab  Result Value Ref Range Status   SARS Coronavirus 2 NEGATIVE NEGATIVE Final    Comment: (NOTE) SARS-CoV-2 target nucleic acids are NOT DETECTED. The SARS-CoV-2 RNA is  generally detectable in upper and lower respiratory specimens during the acute phase of infection. Negative results do not preclude SARS-CoV-2 infection, do not rule out co-infections with other pathogens, and should not be used as the sole basis for treatment or other patient management decisions. Negative results must be combined with clinical observations, patient history, and epidemiological information. The expected result is Negative. Fact Sheet for Patients: SugarRoll.be Fact Sheet for Healthcare Providers: https://www.woods-mathews.com/ This test is not yet approved or cleared by the [place] FDA and  has been authorized for detection and/or diagnosis of SARS-CoV-2 by FDA under an Emergency Use Authorization (EUA). This EUA will remain  in effect (meaning this test can be used) for the duration of the COVID-19 declaration under Section 56 4(b)(1) of the Act, 21 U.S.C. section 360bbb-3(b)(1), unless the authorization is terminated or revoked sooner. Performed at [facility], [place] [address], [place], [place] [address]   Urine Culture     Status: Abnormal (Preliminary result)   Collection Time: 06/25/19 10:58 AM   Specimen: Urine, Catheterized  Result Value Ref Range Status   Specimen Description   Final    URINE, CATHETERIZED Performed at [facility], [address], [place], [place] [address]    Special Requests   Final    keflex Normal Performed at [facility], [address], [place], [place] [address]    Culture >=100,000 COLONIES/mL [facility] MORGANII (A)  Final   Report Status PENDING  Incomplete    Radiology Reports Dg Pelvis 1-2 Views  Result Date: 06/24/2019 CLINICAL DATA:  Left hip pressure ulcers EXAM: PELVIS - 1-2 VIEW COMPARISON:  Is CT urogram 08/26/2017 FINDINGS: The bones are diffusely demineralized. No radiographically evident ulceration. There is diffuse soft tissue edema of the thighs. No  radiopaque marker is identified. No erosion, destructive change or immature periostitis to suggest radiographic features of osteomyelitis. Degenerative changes are noted in the SI joints, both hips and minimally within the symphysis pubis. Bowel gas pattern is unremarkable. IMPRESSION: Diffuse soft tissue edema of the thighs without radiographic evidence of osteomyelitis. Please note a radiopaque marker to indicate the site ulceration is not visualized on this exam. Possibly beyond the collimation. Electronically Signed  By: Kreg Shropshire M.D.   On: 06/24/2019 20:23     CBC Recent Labs  Lab 06/24/19 1815 06/25/19 0558  WBC 7.4 7.3  HGB 11.8* 10.8*  HCT 38.5 34.9*  PLT 425* 378  MCV 98.0 97.5  MCH 30.0 30.2  MCHC 30.6 30.9  RDW 13.4 13.2  LYMPHSABS 1.4  --   MONOABS 0.5  --   EOSABS 0.1  --   BASOSABS 0.0  --     Chemistries  Recent Labs  Lab 06/24/19 1815 06/25/19 0558  NA 136 136  K 4.2 4.3  CL 95* 98  CO2 30 32  GLUCOSE 112* 160*  BUN 22 20  CREATININE 0.36* 0.30*  CALCIUM 8.7* 8.4*  AST 22 19  ALT 29 26  ALKPHOS 63 59  BILITOT 0.1* 0.1*    ----------------------------------------------------------------------------------------------------------------- No results for input(s): CHOL, HDL, LDLCALC, TRIG, CHOLHDL, LDLDIRECT in the last 72 hours.  No results found for: HGBA1C ------------------------------------------------------------------------------------------------------------------ No results for input(s): TSH, T4TOTAL, T3FREE, THYROIDAB in the last 72 hours.  Invalid input(s): FREET3 ------------------------------------------------------------------------------------------------------------------ No results for input(s): VITAMINB12, FOLATE, FERRITIN, TIBC, IRON, RETICCTPCT in the last 72 hours.  Coagulation profile No results for input(s): INR, PROTIME in the last 168 hours.  No results for input(s): DDIMER in the last 72 hours.  Cardiac Enzymes No  results for input(s): CKMB, TROPONINI, MYOGLOBIN in the last 168 hours.  Invalid input(s): CK ------------------------------------------------------------------------------------------------------------------ No results found for: BNP  Shon Hale M.D on 06/27/2019 at 6:22 PM  Go to [email or web address] - for contact info  [facility] - Office  [phone]

## 2019-06-27 NOTE — TOC Progression Note (Signed)
Transition of Care [facility]) - Progression Note    Patient Details  Name: SHANELE NISSAN MRN: [id number] Date of Birth: [birth year]  [facility] CM/SW Contact  Boneta Lucks, RN Phone Number: 06/27/2019, 3:00 PM  Clinical Narrative:    Patient admitted for Sacral decubitus Ulcer, debris done. Patient needing Wound Clinic appointment, Waiting for [facility] to call back with a date a time.  Orders for Home Health RN placed, Husband requesting [facility].  Vaughan Basta accepted the referral. Orders also placed for low pressure air mattress. Juliann Pulse with  [facility] accepted the order.  Husband updated.       Expected Discharge Plan: [place]    Expected Discharge Plan and Services Expected Discharge Plan: [place]   Discharge Planning Services: CM Consult     Expected Discharge Date: 06/28/19               DME Arranged: Other see comment(low pressure air mattress) DME Agency: [employer] Date DME Agency Contacted: 06/27/19 Time DME Agency Contacted: 1500 Representative spoke with at DME Agency: [place]: RN [facility] Agency: [place] ([place]) Date [place]: 06/27/19 Time [place]: [place] Representative spoke with at [place]: Romualdo Bolk   Social Determinants of Health ([place]) Interventions    Readmission Risk Interventions No flowsheet data found.

## 2019-06-27 NOTE — Progress Notes (Signed)
PHARMACY NOTE:  ANTIMICROBIAL RENAL DOSAGE ADJUSTMENT  Current antimicrobial regimen includes a mismatch between antimicrobial dosage and estimated renal function.  As per policy approved by the Pharmacy & Therapeutics and Medical Executive Committees, the antimicrobial dosage will be adjusted accordingly.  Current antimicrobial dosage:  Cephalexin 500mg  q8h  Indication: decubitus ulcer  Renal Function:   Estimated Creatinine Clearance: 46.8 mL/min (A) (by C-G formula based on SCr of 0.3 mg/dL (L)). []      On intermittent HD, scheduled: []      On CRRT    Antimicrobial dosage has been changed to:  Cephalexin 500mg  q12h  Additional comments:   Thank you for allowing pharmacy to be a part of this patient's care.  Despina Pole, [facility] 06/27/2019 8:02 AM

## 2019-06-27 NOTE — Care Management Important Message (Signed)
Important Message  Patient Details  Name: Katie Woods MRN: [id number] Date of Birth: [birth year]   Medicare Important Message Given:  Yes     Tommy Medal 06/27/2019, 3:13 PM

## 2019-06-28 LAB — BASIC METABOLIC PANEL
Anion gap: 11 (ref 5–15)
BUN: 28 mg/dL — ABNORMAL HIGH (ref 8–23)
CO2: 28 mmol/L (ref 22–32)
Calcium: 8.8 mg/dL — ABNORMAL LOW (ref 8.9–10.3)
Chloride: 96 mmol/L — ABNORMAL LOW (ref 98–111)
Creatinine, Ser: <0.3 mg/dL — ABNORMAL LOW (ref 0.44–1.00)
Glucose, Bld: 106 mg/dL — ABNORMAL HIGH (ref 70–99)
Potassium: 3.9 mmol/L (ref 3.5–5.1)
Sodium: 135 mmol/L (ref 135–145)

## 2019-06-28 LAB — CBC
HCT: 34.2 % — ABNORMAL LOW (ref 36.0–46.0)
Hemoglobin: 10.7 g/dL — ABNORMAL LOW (ref 12.0–15.0)
MCH: 30.3 pg (ref 26.0–34.0)
MCHC: 31.3 g/dL (ref 30.0–36.0)
MCV: 96.9 fL (ref 80.0–100.0)
Platelets: 377 10*3/uL (ref 150–400)
RBC: 3.53 MIL/uL — ABNORMAL LOW (ref 3.87–5.11)
RDW: 13.5 % (ref 11.5–15.5)
WBC: 8.4 10*3/uL (ref 4.0–10.5)
nRBC: 0 % (ref 0.0–0.2)

## 2019-06-28 LAB — URINE CULTURE
Culture: >=100000 — AB
Special Requests: NORMAL

## 2019-06-28 MED ORDER — CEFDINIR 300 MG PO CAPS
300.0000 mg | ORAL_CAPSULE | Freq: Once | ORAL | Status: AC
  Administered 2019-06-28: 14:00:00 300 mg via ORAL
  Filled 2019-06-28: qty 1

## 2019-06-28 MED ORDER — ENSURE ENLIVE PO LIQD: 1.0000 | Freq: Two times a day (BID) | ORAL | 12 refills | Status: AC

## 2019-06-28 MED ORDER — LACTULOSE 10 GM/15ML PO SOLN: 30.0000 g | ORAL | 0 refills | Status: AC

## 2019-06-28 MED ORDER — SANTYL 250 UNIT/GM EX OINT: 1.0000 "application " | TOPICAL_OINTMENT | Freq: Every day | CUTANEOUS | 3 refills | Status: AC

## 2019-06-28 MED ORDER — METOPROLOL TARTRATE 25 MG PO TABS: 25.0000 mg | ORAL_TABLET | Freq: Two times a day (BID) | ORAL | 2 refills | Status: AC

## 2019-06-28 MED ORDER — JUVEN PO PACK: 1.0000 | PACK | Freq: Two times a day (BID) | ORAL | 3 refills | Status: AC

## 2019-06-28 MED ORDER — CEFDINIR 300 MG PO CAPS: 300.0000 mg | ORAL_CAPSULE | Freq: Two times a day (BID) | ORAL | 0 refills | Status: AC

## 2019-06-28 MED ORDER — BISACODYL 10 MG RE SUPP: 10.0000 mg | RECTAL | 1 refills | Status: AC

## 2019-06-28 NOTE — Discharge Instructions (Signed)
1)A visiting home nurse will come in weekly to help you with complex wound care 2)At least once weekly visits to the wound clinic advised---First Wound Clinic appointment is Thursday 06/30/2019 3)Change wound dressings twice a day as advised--- you will need to remove the old dressings, irrigated the wound if possible with saline solution, apply Santyl ointment to the 2 deep wounds on the left buttock area--then apply wet-to-dry gauze packings with foam dressings on top of that.  Please apply ABD and/or foam dressings to the other deep tissue injury areas that are not open yet-as demonstrated to you while in the hospital. 4)Continue routine care for the suprapubic Foley catheter--- change it at least once a month and as needed, given urinary tract infection with Morganella morganii bacteria--advised that you change this particular suprapubic catheter in about 5 to 7 days (upon completion of antibiotics)

## 2019-06-28 NOTE — Plan of Care (Signed)

## 2019-06-28 NOTE — Discharge Summary (Signed)
Katie Woods, is a 73 y.o. female  DOB 04/20/1946  MRN [id number].  Admission date:  06/24/2019  Admitting Physician  Lilyan GilfordAsia B Zierle-Ghosh, DO  Discharge Date:  06/28/2019   Primary MD  Barbie BannerWilson, Fred H, MD  Recommendations for primary care physician for things to follow:   1)A visiting home nurse will come in weekly to help you with complex wound care 2)At least once weekly visits to the wound clinic advised---First Wound Clinic appointment is Thursday 06/30/2019 3)Change wound dressings twice a day as advised--- you will need to remove the old dressings, irrigated the wound if possible with saline solution, apply Santyl ointment to the 2 deep wounds on the left buttock area--then apply wet-to-dry gauze packings with foam dressings on top of that.  Please apply ABD and/or foam dressings to the other deep tissue injury areas that are not open yet-as demonstrated to you while in the hospital. 4)Continue routine care for the suprapubic Foley catheter--- change it at least once a month and as needed, given urinary tract infection with Morganella morganii bacteria--advised that you change this particular suprapubic catheter in about 5 to 7 days (upon completion of antibiotics)  Admission Diagnosis  Protein malnutrition (HCC) [E46] Wound infection [T14.8XXA, L08.9] Pressure injury of right buttock, stage 4 (HCC) [L89.314]   Discharge Diagnosis  Protein malnutrition (HCC) [E46] Wound infection [T14.8XXA, L08.9] Pressure injury of right buttock, stage 4 (HCC) [L89.314]  Principal Problem:   Sacral decubitus ulcer, stage IV --- presumed infected Active Problems:   MULTIPLE SCLEROSIS, PROGRESSIVE/RELAPSING   HTN (hypertension)   Trigeminal neuralgia   FTT (failure to thrive) in adult/Protein Caloric Malnutrition      Past Medical History:  Diagnosis Date   Chronic constipation    GERD (gastroesophageal reflux  disease)    Hypertension    Multiple sclerosis (HCC) 1978   Paralysis (HCC)    Trigeminal neuralgia     Past Surgical History:  Procedure Laterality Date   cholescystectomy     suprapubic catheter     TONSILLECTOMY     WISDOM TOOTH EXTRACTION         HPI  from the history and physical done on the day of admission:    Katie Woods  is a 73 y.o. female, with history of trigeminal neuralgia, multiple sclerosis, hypertension, GERD, chronic constipation presents to the ED today for sacral wound.  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  Husband is primary caretaker.  Husband reports that 2 weeks ago he started to notice 1 wound.  He put ointment on it and a pad, and continue to monitor.  Yesterday he noticed that several more wounds were visible.  This morning he noticed that they were malodorous.  During his dressing changes patient reports they are quite painful.  She reports that right now the pain is 7 out of 10.  Pain is intermittent and when he changes the dressing is 9 out of 10.  Wounds have been weeping for the past 2 days.  The drainage is  dark brown, and foul-smelling.  Surrounding the wounds is erythematous skin that is indurated.  Husband reports that he started noticing that yesterday.  They both report the patient has had subjective fevers, but no measured fever.  She has not had any shortness of breath, chest pain, abdominal pain, nausea, constipation (due to change of diet with higher fiber intake), or any other new rashes.    Hospital Course:    Brief Summary- 73 y.o.female,with history of trigeminal neuralgia, multiple sclerosis, hypertension, GERD, chronic constipation admitted on 06/24/2019 with multiple decubitus ulcers and sacral wounds. 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 (bed and wheelchair bound) -Urine culture yielding Morganella morganii   A/p 1)Multiple sacral decubitus---POA--status post bedside debridement of 1 of the unstageable sacral ulcers- -Post  debridement this wound is about 4-1/2 inches deep please see photos in epic,  -twice daily Santyl and wet-to-dry dressing and packing as advised by general surgeon -treated with Rocephin for infected decubitus ulcers  -Discharge on Omnicef -Post discharge patient will need to follow-up with wound clinic for ongoing monitoring--wound care appointment at either wound clinic given to patient--first wound care appointment 06/30/2019  2)Trigeminal neuralgia---continue Lyrica 100 mg twice a day and Tegretol.Okay to use Tylenol and tramadol as needed  3)Multiple sclerosis---patient has underlying neurogenic bladder with suprapubic Foley in situ, as well as neurogenic bowel with chronic constipation--at baseline patient is bedbound and wheelchair bound and totally dependent on others for ADLs--she has neuromuscular weakness and contractures -Continue routine care for suprapubic tube Foley catheter,continue bowel regimen for constipation -She gets weekly interferon injections,continue Enablex  4)HTN-stable continue metoprolol 50 mg daily  5)Protein caloric malnutrition---nutritional consult appreciated, c/n supplements  6) Morganella morganii UTI--versus colonization in a patient with chronic suprapubic Foley catheter--treated with IV Rocephin, discharged on p.o. Omnicef   7)Constipation-neurogenic bowel in the setting of multiple sclerosis and bedbound status -patient required manual disimpaction by RN after failed Dulcolax suppository and soapsuds enema -Advised to use MiraLAX as prescribed, may use Dulcolax suppository as needed as well  Code Status:DNR  Family Communication:Discussed with husband at bedside  Disposition Plan:--Patient and husband declined SNF rehab, [facility]  Consults :Gen surgery  Code Status : DNR  Discharge Condition: stable  Follow UP  Follow-up Information    [facility]              . Go to.   Why: Sept 17th  10 am Contact information: [address] [place] [phone]          Diet and Activity recommendation:  As advised  Discharge Instructions    Discharge Instructions    Bed rest   Complete by: As directed    Out of Bed to chair with Equipment Please turn patient every 2 hours   Call MD for:  difficulty breathing, headache or visual disturbances   Complete by: As directed    Call MD for:  extreme fatigue   Complete by: As directed    Call MD for:  persistant dizziness or light-headedness   Complete by: As directed    Call MD for:  persistant nausea and vomiting   Complete by: As directed    Call MD for:  redness, tenderness, or signs of infection (pain, swelling, redness, odor or green/yellow discharge around incision site)   Complete by: As directed    Call MD for:  severe uncontrolled pain   Complete by: As directed  Call MD for:  temperature >100.4   Complete by: As directed    Diet general   Complete by: As directed    Nutritional supplements   Discharge instructions   Complete by: As directed    1)A visiting home nurse will come in weekly to help you with complex wound care 2)At least once weekly visits to the wound clinic advised---First Wound Clinic appointment is Thursday 06/30/2019 3)Change wound dressings twice a day as advised--- you will need to remove the old dressings, irrigated the wound if possible with saline solution, apply Santyl ointment to the 2 deep wounds on the left buttock area--then apply wet-to-dry gauze packings with foam dressings on top of that.  Please apply ABD and/or foam dressings to the other deep tissue injury areas that are not open yet-as demonstrated to you while in the hospital. 4)Continue routine care for the suprapubic Foley catheter--- change it at least once a month and as needed, given urinary tract infection with Morganella morganii bacteria--advised that  you change this particular suprapubic catheter in about 5 to 7 days (upon completion of antibiotics)        Discharge Medications     Allergies as of 06/28/2019      Reactions   Hydrocodone Nausea Only   Sulfonamide Derivatives    Macrodantin [nitrofurantoin Macrocrystal] Rash      Medication List    TAKE these medications   acetaminophen 500 MG tablet Commonly known as: TYLENOL Take 500 mg by mouth. 6 TIMES A DAY   Avonex Pen 30 MCG/0.5ML Ajkt Generic drug: Interferon Beta-1a Inject 30 mcg as directed once a week. Every tuesday   bisacodyl 10 MG suppository Commonly known as: Dulcolax Place 1 suppository (10 mg total) rectally every Sunday. For constipation Start taking on: July 03, 2019   carbamazepine 300 MG 12 hr capsule Commonly known as: CARBATROL Take 300 mg by mouth 2 (two) times daily. 200mg  in the afternoon, 300mg  at bedtime.   cefdinir 300 MG capsule Commonly known as: OMNICEF Take 1 capsule (300 mg total) by mouth 2 (two) times daily for 7 days.   ferrous sulfate 325 (65 FE) MG tablet Take 325 mg by mouth 3 (three) times daily with meals.   L-Lysine 500 MG Tabs Take by mouth.   lactulose 10 GM/15ML solution Commonly known as: CHRONULAC Take 45 mLs (30 g total) by mouth every Monday, Wednesday, and Friday. for constipation Start taking on: June 29, 2019   Lyrica 100 MG capsule Generic drug: pregabalin Take 1 tablet by mouth 2 (two) times daily.   metoprolol tartrate 25 MG tablet Commonly known as: LOPRESSOR Take 1 tablet (25 mg total) by mouth 2 (two) times daily. What changed:   medication strength  how much to take  Another medication with the same name was removed. Continue taking this medication, and follow the directions you see here.   nutrition supplement (JUVEN) Pack Take 1 packet by mouth 2 (two) times daily between meals.   feeding supplement (ENSURE ENLIVE) Liqd Take 237 mLs by mouth 2 (two) times daily between  meals.   pantoprazole 40 MG tablet Commonly known as: PROTONIX Take 40 mg by mouth. hs   QC Tumeric Complex 500 MG Caps Generic drug: Turmeric Take 1 tablet by mouth 2 (two) times daily.   Santyl ointment Generic drug: collagenase Apply 1 application topically daily. Use as directed for dressing changes twice a day to deep decubitus ulcers/ wounds What changed: additional instructions   sertraline 100  MG tablet Commonly known as: ZOLOFT Take 100 mg by mouth daily.   solifenacin 10 MG tablet Commonly known as: VESICARE Take 5 mg by mouth daily.   Systane Ultra PF 0.4-0.3 % Soln Generic drug: Polyethyl Glyc-Propyl Glyc PF Apply 1 drop to eye 2 (two) times daily. Both eyes each morning and evening.   vitamin C 500 MG tablet Commonly known as: ASCORBIC ACID Take 500 mg by mouth at bedtime.   Vitamin D (Cholecalciferol) 25 MCG (1000 UT) Tabs Take 5 tablets by mouth at bedtime.   vitamin E 600 UNIT capsule Take 600 Units by mouth daily.       Major procedures and Radiology Reports - PLEASE review detailed and final reports for all details, in brief -    Dg Pelvis 1-2 Views  Result Date: 06/24/2019 CLINICAL DATA:  Left hip pressure ulcers EXAM: PELVIS - 1-2 VIEW COMPARISON:  Is CT urogram 08/26/2017 FINDINGS: The bones are diffusely demineralized. No radiographically evident ulceration. There is diffuse soft tissue edema of the thighs. No radiopaque marker is identified. No erosion, destructive change or immature periostitis to suggest radiographic features of osteomyelitis. Degenerative changes are noted in the SI joints, both hips and minimally within the symphysis pubis. Bowel gas pattern is unremarkable. IMPRESSION: Diffuse soft tissue edema of the thighs without radiographic evidence of osteomyelitis. Please note a radiopaque marker to indicate the site ulceration is not visualized on this exam. Possibly beyond the collimation. Electronically Signed   By: Lovena Le M.D.    On: 06/24/2019 20:23    Micro Results    Recent Results (from the past 240 hour(s))  Culture, blood (routine x 2)     Status: None (Preliminary result)   Collection Time: 06/24/19  6:15 PM   Specimen: BLOOD LEFT WRIST  Result Value Ref Range Status   Specimen Description BLOOD LEFT WRIST  Final   Special Requests   Final    BOTTLES DRAWN AEROBIC AND ANAEROBIC Blood Culture adequate volume   Culture   Final    NO GROWTH 4 DAYS Performed at [facility], [address], [place], [place] [address]    Report Status PENDING  Incomplete  SARS CORONAVIRUS 2 (TAT 6-24 HRS) Nasopharyngeal Nasopharyngeal Swab     Status: None   Collection Time: 06/24/19  8:40 PM   Specimen: Nasopharyngeal Swab  Result Value Ref Range Status   SARS Coronavirus 2 NEGATIVE NEGATIVE Final    Comment: (NOTE) SARS-CoV-2 target nucleic acids are NOT DETECTED. The SARS-CoV-2 RNA is generally detectable in upper and lower respiratory specimens during the acute phase of infection. Negative results do not preclude SARS-CoV-2 infection, do not rule out co-infections with other pathogens, and should not be used as the sole basis for treatment or other patient management decisions. Negative results must be combined with clinical observations, patient history, and epidemiological information. The expected result is Negative. Fact Sheet for Patients: SugarRoll.be Fact Sheet for Healthcare Providers: https://www.woods-mathews.com/ This test is not yet approved or cleared by the [place] FDA and  has been authorized for detection and/or diagnosis of SARS-CoV-2 by FDA under an Emergency Use Authorization (EUA). This EUA will remain  in effect (meaning this test can be used) for the duration of the COVID-19 declaration under Section 56 4(b)(1) of the Act, 21 U.S.C. section 360bbb-3(b)(1), unless the authorization is terminated or revoked sooner. Performed at [facility], [place] [address] [place], [place] [address]   Urine Culture  Status: Abnormal   Collection Time: 06/25/19 10:58 AM   Specimen: Urine, Catheterized  Result Value Ref Range Status   Specimen Description   Final    URINE, CATHETERIZED Performed at [facility], [address] [place], [place] [address]    Special Requests   Final    keflex Normal Performed at [facility], [address] [place], [place] [address]    Culture >=100,000 COLONIES/mL [facility] (A)  Final   Report Status 06/28/2019 FINAL  Final   Organism ID, Bacteria MORGANELLA MORGANII (A)  Final      Susceptibility   Morganella morganii - MIC*    AMPICILLIN >=32 RESISTANT Resistant     CEFAZOLIN >=64 RESISTANT Resistant     CEFTRIAXONE 4 SENSITIVE Sensitive     CIPROFLOXACIN <=0.25 SENSITIVE Sensitive     GENTAMICIN <=1 SENSITIVE Sensitive     IMIPENEM <=0.25 SENSITIVE Sensitive     NITROFURANTOIN 256 RESISTANT Resistant     TRIMETH/SULFA <=20 SENSITIVE Sensitive     AMPICILLIN/SULBACTAM >=32 RESISTANT Resistant     PIP/TAZO <=4 SENSITIVE Sensitive     * >=100,000 COLONIES/mL MORGANELLA MORGANII       Today   Subjective    Katie Woods today has no new complaints -She is eager to go home -Husband and husband's nephew at bedside, questions answered          Patient has been seen and examined prior to discharge   Objective   Blood pressure 116/62, pulse 90, temperature 97.8 F (36.6 C), resp. rate 17, height 5\' 2"  (1.575 m), weight 48.1 kg, SpO2 95 %.   Intake/Output Summary (Last 24 hours) at 06/28/2019 1302 Last data filed at 06/28/2019 0300 Gross per 24 hour  Intake 364.99 ml  Output 850 ml  Net -485.01 ml    Exam Gen:- Awake Alert,chronically ill-appearing, cachectic HEENT:- [place].AT, No sclera icterus Neck-Supple Neck,No JVD,.  Lungs- CTAB , fair symmetrical air movement CV- S1, S2 normal, regular  Abd- +ve B.Sounds, Abd Soft, No tenderness, suprapubic catheter in  situ Extremity - No edema, pedal pulses present  Psych-affect is appropriate, oriented x3 Neuro-neuromuscular deficits with contractures consistent with advanced multiple sclerosis SKIn--multiple decubitus ulcers please see photos below - Media Information    Document Information  Photos    06/26/2019 09:57  Attached To:  Hospital Encounter on 06/24/19  Source Information  Shon HaleEmokpae, Elaijah Munoz, MD   Ap-Dept 300     Data Review   CBC w Diff:  Lab Results  Component Value Date   WBC 8.4 06/28/2019   HGB 10.7 (L) 06/28/2019   HCT 34.2 (L) 06/28/2019   PLT 377 06/28/2019   LYMPHOPCT 20 06/24/2019   MONOPCT 7 06/24/2019   EOSPCT 1 06/24/2019   BASOPCT 0 06/24/2019   CMP:  Lab Results  Component Value Date   NA 135 06/28/2019   K 3.9 06/28/2019   CL 96 (L) 06/28/2019   CO2 28 06/28/2019   BUN 28 (H) 06/28/2019   CREATININE <0.30 (L) 06/28/2019   PROT 5.6 (L) 06/25/2019   ALBUMIN 2.2 (L) 06/25/2019   BILITOT 0.1 (L) 06/25/2019   ALKPHOS 59 06/25/2019   AST 19 06/25/2019   ALT 26 06/25/2019  .  Total Discharge time is about 33 minutes  Shon Haleourage Guled Gahan M.D on 06/28/2019 at 1:02 PM  Go to [email or web address] -  for contact info  [facility] - Office  [phone]

## 2019-06-28 NOTE — TOC Transition Note (Signed)
Transition of Care [facility]) - CM/SW Discharge Note   Patient Details  Name: Katie Woods MRN: [id number] Date of Birth: 08/20/1946  Transition of Care [facility]) CM/SW Contact:  Boneta Lucks, RN Phone Number: 06/28/2019, 10:49 AM   Clinical Narrative:   Patient discharging today home with husband.  Set up wound Clinic appointment in [place] for setp 17th at [place] with [employer] verified orders have been sent for low pressure air mattress.  Updated Vaughan Basta with [place] of discharge today.  Updated Monica Martinez- husband and placed appointment on AVS.    Final next level of care: [place] Barriers to Discharge: Barriers Resolved   Patient Goals and CMS Choice Patient states their goals for this hospitalization and ongoing recovery are:: to go home. CMS Medicare.gov Compare Post Acute Care list provided to:: Patient Represenative (must comment)(husband) Choice offered to / list presented to : Spouse  Discharge Placement                  Name of family member notified: Monica Martinez- husband Patient and family notified of of transfer: 06/28/19  Discharge Plan and Services   Discharge Planning Services: CM Consult            DME Arranged: Other see comment(low pressure air mattress) DME Agency: [employer] Date DME Agency Contacted: 06/27/19 Time DME Agency Contacted: 1500 Representative spoke with at DME Agency: [place]: RN [facility] Agency: [place] ([place]) Date [place]: 06/27/19 Time [place]: [place] Representative spoke with at [place]: Romualdo Bolk       Readmission Risk Interventions No flowsheet data found.

## 2019-06-28 NOTE — Progress Notes (Addendum)
   LeftLeft Buttocks-- Staging:Stage IV - Full thickness tissue loss with exposed bone, tendon or muscle.Stage IV - Full thickness tissue loss with exposed bone, tendon or muscle.. 2.6 CM long x 3.8CM  W. 8-12 CM tunnelling, undermining at 12 CM - Wound depth is 4.5 inches -wound surface area is 16, wound volume is 80 --- Left Lower stage III wound- 3 CM long , 4 CM wide-this area 12 cm  Multiple deep tissue injuries/pressure injuries also  -Additional sacral wound 3 cm long, 2 cm wide 3 cm deep 6 cm total wound area with a wound volume of 18  Media Information    Document Information  Photos    06/26/2019 09:57  Attached To:  Hospital Encounter on 06/24/19  Source Information  Roxan Hockey, MD  [facility] [place], MD

## 2019-06-28 NOTE — Progress Notes (Signed)
Home Health Choices:  [facility] [phone]  Add [facility] my Favorites Quality of Patient Care Rating 4 out of 5 stars Patient Survey Summary Rating 4 out of 5 stars [facility] [phone]  Add [facility] Quality of Patient Care Rating 3 out of 5 stars Patient Survey Summary Rating 5 out of 5 stars [facility] [phone]  Add [facility] Quality of Patient Care Rating 3 out of 5 stars Patient Survey Summary Rating 4 out of 5 stars [facility] [phone]  Add [facility] Quality of Patient Care Rating 3  out of 5 stars Patient Survey Summary Rating 4 out of 5 stars [facility] [phone]  Add [facility] Quality of Patient Care Rating 4  out of 5 stars Patient Survey Summary Rating 3 out of 5 stars [facility] [phone]  Add [facility] Quality of Patient Care Rating 4 out of 5 stars Patient Survey Summary Rating 4 out of 5 stars [facility] [phone]  Add [facility] Quality of Patient Care Rating 4 out of 5 stars Patient Survey Summary Rating 4 out of 5 stars [facility] [phone]  Add [facility] Quality of Patient Care Rating 4 out of 5 stars Patient Survey Summary Rating 4 out of 5 stars [facility] [phone]  Add [facility] Quality of Patient Care Rating 3 out of 5 stars Patient Survey Summary Rating 3 out of 5 stars [facility] [phone]  Add [facility] Quality of Patient Care Rating 3  out of 5 stars Patient Survey Summary Rating 4 out of 5 stars [facility] [phone]  Add [facility] Quality of Patient Care Rating 3 out of 5 stars Patient Survey Summary Rating 4 out of 5 stars [facility] [phone]  Add [facility] Quality of Patient Care Rating 3  out of 5 stars Patient Survey Summary Rating 3 out of 5 stars [facility] [phone]  Add [facility] Quality of Patient Care Rating 3  out of 5 stars Not Available11 [facility] [phone]  Add [facility]avorites 

## 2019-06-29 LAB — CULTURE, BLOOD (ROUTINE X 2)
Culture: NO GROWTH
Special Requests: ADEQUATE

## 2019-10-14 DEATH — deceased

## 2020-11-25 IMAGING — DX DG PELVIS 1-2V
1 series · 1 of 1 positions shown · non-contrast
Comparison: Is CT urogram 08/26/2017

CLINICAL DATA: Left hip pressure ulcers

EXAM:
PELVIS - 1-2 VIEW

[pelvis ap]
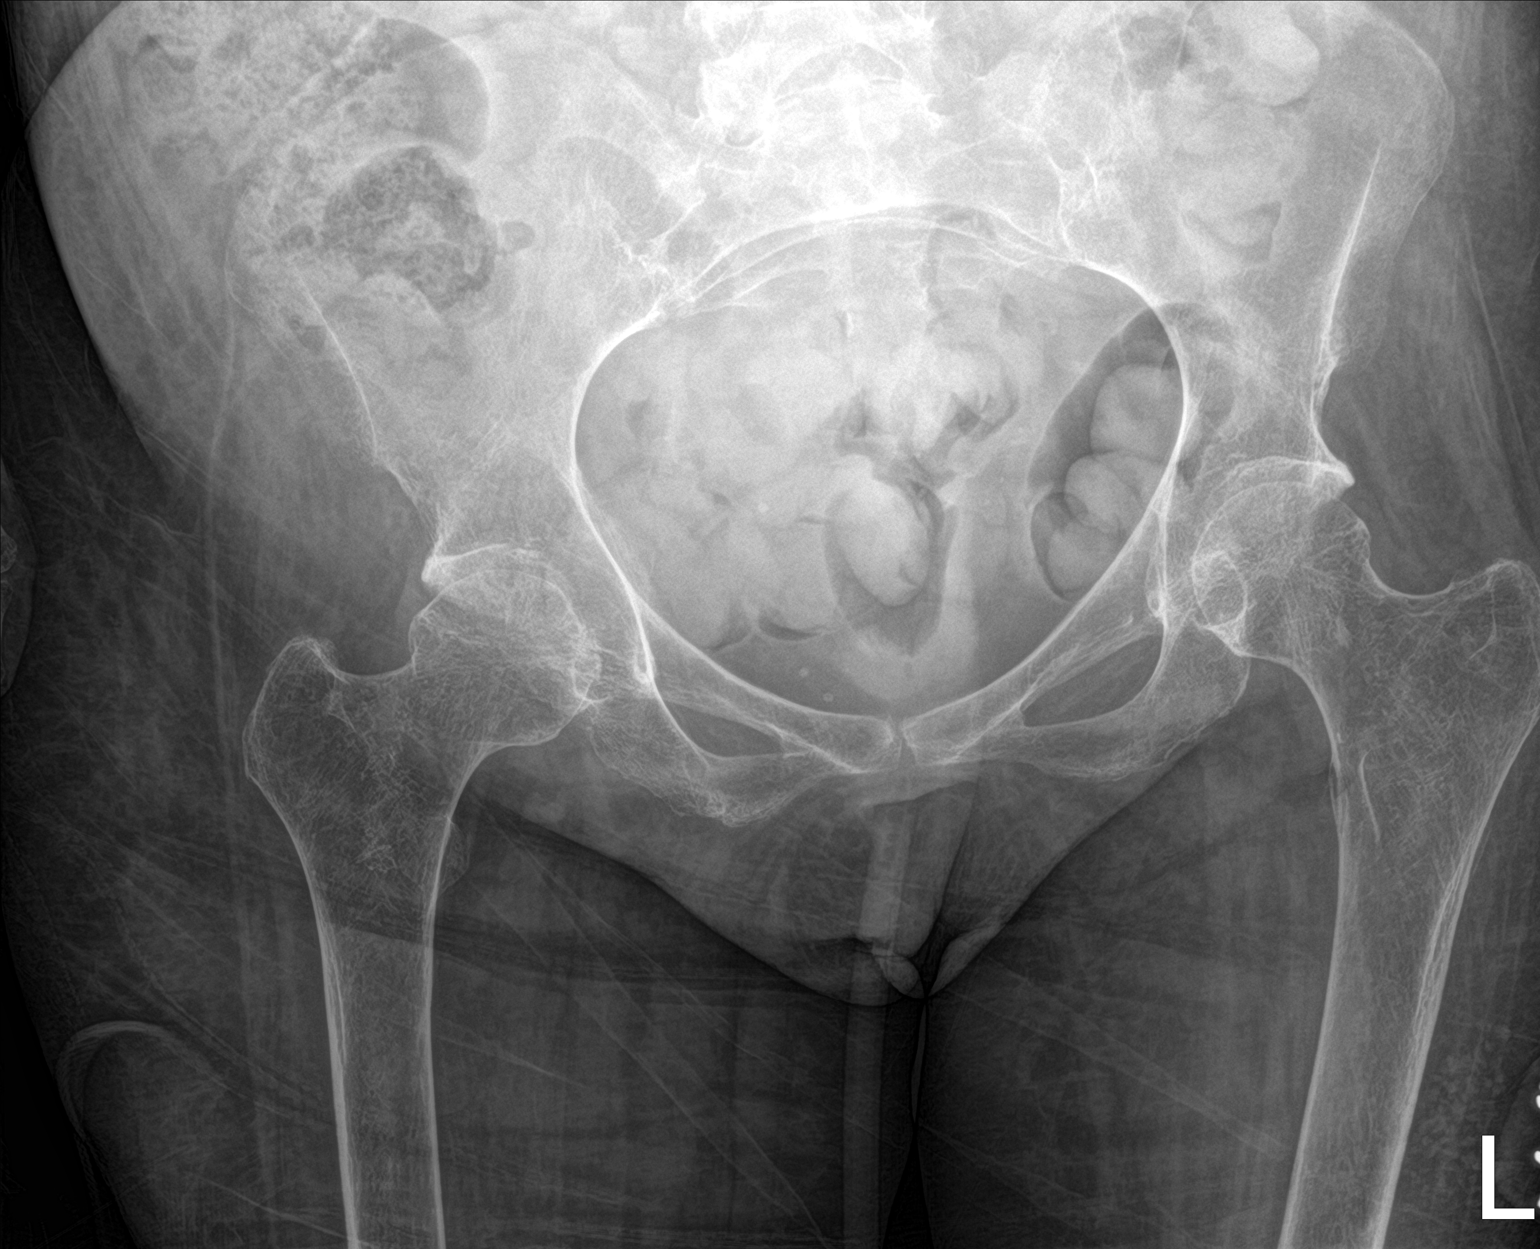

[1 of 1 positions shown; findings below may reference images not displayed]

FINDINGS: The bones are diffusely demineralized. No radiographically evident
ulceration. There is diffuse soft tissue edema of the thighs. No
radiopaque marker is identified. No erosion, destructive change or
immature periostitis to suggest radiographic features of
osteomyelitis. Degenerative changes are noted in the SI joints, both
hips and minimally within the symphysis pubis. Bowel gas pattern is
unremarkable.
IMPRESSION: Diffuse soft tissue edema of the thighs without radiographic
evidence of osteomyelitis.

Please note a radiopaque marker to indicate the site ulceration is
not visualized on this exam. Possibly beyond the collimation.
